# Patient Record
Sex: Female | Born: 1995
Health system: Southern US, Community
[De-identification: ages and names within clinical notes are randomized; demographics above are authoritative.]

## PROBLEM LIST (undated history)

## (undated) DIAGNOSIS — D649 Anemia, unspecified: Secondary | ICD-10-CM

---
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
## 2015-03-07 ENCOUNTER — Emergency Department
Admission: EM | Admit: 2015-03-07 | Discharge: 2015-03-07 | Discharge status: Against Medical Advice | Payer: Medicaid Other | Attending: Emergency Medicine | Admitting: Emergency Medicine

## 2015-03-07 ENCOUNTER — Encounter: Payer: Self-pay | Admitting: Urgent Care

## 2015-03-07 DIAGNOSIS — R112 Nausea with vomiting, unspecified: Secondary | ICD-10-CM | POA: Diagnosis present

## 2015-03-07 DIAGNOSIS — R509 Fever, unspecified: Secondary | ICD-10-CM | POA: Diagnosis not present

## 2015-03-07 DIAGNOSIS — R1031 Right lower quadrant pain: Secondary | ICD-10-CM | POA: Diagnosis not present

## 2015-03-07 DIAGNOSIS — R197 Diarrhea, unspecified: Secondary | ICD-10-CM | POA: Diagnosis not present

## 2015-03-07 DIAGNOSIS — Z3202 Encounter for pregnancy test, result negative: Secondary | ICD-10-CM | POA: Diagnosis not present

## 2015-03-07 LAB — COMPREHENSIVE METABOLIC PANEL
ALBUMIN: 3.8 g/dL (ref 3.5–5.0)
ALK PHOS: 66 U/L (ref 38–126)
ALT: 16 U/L (ref 14–54)
ANION GAP: 7 (ref 5–15)
AST: 22 U/L (ref 15–41)
BUN: 12 mg/dL (ref 6–20)
CALCIUM: 8.8 mg/dL — AB (ref 8.9–10.3)
CHLORIDE: 105 mmol/L (ref 101–111)
CO2: 23 mmol/L (ref 22–32)
Creatinine, Ser: 0.95 mg/dL (ref 0.44–1.00)
GFR calc non Af Amer: >60 mL/min (ref >60)
GLUCOSE: 108 mg/dL — AB (ref 65–99)
POTASSIUM: 3.7 mmol/L (ref 3.5–5.1)
SODIUM: 135 mmol/L (ref 135–145)
Total Bilirubin: 0.2 mg/dL — ABNORMAL LOW (ref 0.3–1.2)
Total Protein: 7.6 g/dL (ref 6.5–8.1)

## 2015-03-07 LAB — URINALYSIS COMPLETE WITH MICROSCOPIC (ARMC ONLY)
BACTERIA UA: NONE SEEN
Bilirubin Urine: NEGATIVE
Glucose, UA: NEGATIVE mg/dL
Hgb urine dipstick: NEGATIVE
KETONES UR: NEGATIVE mg/dL
Nitrite: NEGATIVE
PH: 5 (ref 5.0–8.0)
PROTEIN: NEGATIVE mg/dL
SPECIFIC GRAVITY, URINE: 1.023 (ref 1.005–1.030)

## 2015-03-07 LAB — INFLUENZA PANEL BY PCR (TYPE A & B)
H1N1 flu by pcr: NOT DETECTED
INFLBPCR: NEGATIVE
Influenza A By PCR: NEGATIVE

## 2015-03-07 LAB — PREGNANCY, URINE: PREG TEST UR: NEGATIVE

## 2015-03-07 LAB — CBC
HEMATOCRIT: 42.5 % (ref 35.0–47.0)
HEMOGLOBIN: 13.9 g/dL (ref 12.0–16.0)
MCH: 26.8 pg (ref 26.0–34.0)
MCHC: 32.7 g/dL (ref 32.0–36.0)
MCV: 81.8 fL (ref 80.0–100.0)
Platelets: 264 10*3/uL (ref 150–440)
RBC: 5.19 MIL/uL (ref 3.80–5.20)
RDW: 14.7 % — ABNORMAL HIGH (ref 11.5–14.5)
WBC: 7.9 10*3/uL (ref 3.6–11.0)

## 2015-03-07 LAB — LIPASE, BLOOD: LIPASE: 26 U/L (ref 11–51)

## 2015-03-07 MED ORDER — ACETAMINOPHEN 325 MG PO TABS
650.0000 mg | ORAL_TABLET | Freq: Once | ORAL | Status: AC
  Administered 2015-03-07: 650 mg via ORAL

## 2015-03-07 MED ORDER — ONDANSETRON HCL 4 MG/2ML IJ SOLN
4.0000 mg | Freq: Once | INTRAMUSCULAR | Status: AC | PRN
  Administered 2015-03-07: 4 mg via INTRAVENOUS

## 2015-03-07 MED ORDER — ONDANSETRON HCL 4 MG/2ML IJ SOLN
INTRAMUSCULAR | Status: AC
  Filled 2015-03-07: qty 2

## 2015-03-07 MED ORDER — ACETAMINOPHEN 325 MG PO TABS
ORAL_TABLET | ORAL | Status: AC
  Filled 2015-03-07: qty 2

## 2015-03-07 MED ORDER — SODIUM CHLORIDE 0.9 % IV BOLUS (SEPSIS)
1000.0000 mL | Freq: Once | INTRAVENOUS | Status: AC
  Administered 2015-03-07: 1000 mL via INTRAVENOUS

## 2015-03-07 MED ORDER — AZITHROMYCIN 250 MG PO TABS
ORAL_TABLET | ORAL | Status: AC
  Filled 2015-03-07: qty 2

## 2015-03-07 NOTE — ED Notes (Addendum)
Patient presents with c/o N/V/D x 2 days. (+) fever; unable to report tmax. Daughter Dx'd with influenza last week. Also c/o diffuse abdominal pain.

## 2015-03-07 NOTE — ED Notes (Signed)
Pt refusing CT scan, wants to leave, informed Dr Manson PasseyBrown.

## 2015-03-08 NOTE — ED Provider Notes (Signed)
[facility] Emergency Department Provider Note  ____________________________________________  Time seen: 3:45 AM  I have reviewed the triage vital signs and the nursing notes.   HISTORY  Chief Complaint Nausea; Emesis; and Diarrhea      HPI Chelsea Mcpherson is a 19 y.o. female presents with nausea vomiting and diarrhea 2 days accompanied by subjective fever. Patient states the symptoms started after eating out 2 days ago. In addition patient admits that her daughter was diagnosed with influenza 1 week ago.     Past medical history None There are no active problems to display for this patient.   Past surgical history None No current outpatient prescriptions on file.  Allergies No known drug allergies No family history on file.  Social History Social History  Substance Use Topics  . Smoking status: Never Smoker   . Smokeless tobacco: None  . Alcohol Use: No    Review of Systems  Constitutional: Negative for fever. Eyes: Negative for visual changes. ENT: Negative for sore throat. Cardiovascular: Negative for chest pain. Respiratory: Negative for shortness of breath. Gastrointestinal: Positive for abdominal pain, vomiting and diarrhea. Genitourinary: Negative for dysuria. Musculoskeletal: Negative for back pain. Skin: Negative for rash. Neurological: Negative for headaches, focal weakness or numbness.   10-point ROS otherwise negative.  ____________________________________________   PHYSICAL EXAM:  VITAL SIGNS: ED Triage Vitals  Enc Vitals Group     BP 03/07/15 0227 115/68 mmHg     Pulse Rate 03/07/15 0227 115     Resp 03/07/15 0227 20     Temp 03/07/15 0227 100.6 F (38.1 C)     Temp Source 03/07/15 0227 Oral     SpO2 03/07/15 0227 99 %     Weight 03/07/15 0227 200 lb (90.719 kg)     Height 03/07/15 0227  (1.702 m)     Head Cir --      Peak Flow --      Pain Score 03/07/15 0228 7     Pain Loc --      Pain  Edu? --      Excl. in [place]? --      Constitutional: Alert and oriented. Well appearing and in no distress. Eyes: Conjunctivae are normal. PERRL. Normal extraocular movements. ENT   Head: Normocephalic and atraumatic.   Nose: No congestion/rhinnorhea.   Mouth/Throat: Mucous membranes are moist.   Neck: No stridor. Hematological/Lymphatic/Immunilogical: No cervical lymphadenopathy. Cardiovascular: Normal rate, regular rhythm. Normal and symmetric distal pulses are present in all extremities. No murmurs, rubs, or gallops. Respiratory: Normal respiratory effort without tachypnea nor retractions. Breath sounds are clear and equal bilaterally. No wheezes/rales/rhonchi. Gastrointestinal: Right lower quadrant tenderness to palpation. No distention. There is no CVA tenderness. Genitourinary: deferred Musculoskeletal: Nontender with normal range of motion in all extremities. No joint effusions.  No lower extremity tenderness nor edema. Neurologic:  Normal speech and language. No gross focal neurologic deficits are appreciated. Speech is normal.  Skin:  Skin is warm, dry and intact. No rash noted. Psychiatric: Mood and affect are normal. Speech and behavior are normal. Patient exhibits appropriate insight and judgment.  ____________________________________________    LABS (pertinent positives/negatives)  Labs Reviewed  COMPREHENSIVE METABOLIC PANEL - Abnormal; Notable for the following:    Glucose, Bld 108 (*)    Calcium 8.8 (*)    Total Bilirubin 0.2 (*)    All other components within normal limits  CBC - Abnormal; Notable for the following:    RDW 14.7 (*)  All other components within normal limits  URINALYSIS COMPLETEWITH MICROSCOPIC ([facility] ONLY) - Abnormal; Notable for the following:    Color, Urine YELLOW (*)    APPearance HAZY (*)    Leukocytes, UA TRACE (*)    Squamous Epithelial / LPF 6-30 (*)    All other components within normal limits  LIPASE, BLOOD  PREGNANCY,  URINE  INFLUENZA PANEL BY PCR (TYPE A & B, H1N1)         INITIAL IMPRESSION / ASSESSMENT AND PLAN / ED COURSE  Pertinent labs & imaging results that were available during my care of the patient were reviewed by me and considered in my medical decision making (see chart for details).  Given right lower quadrant pain with palpation, vomiting and fever concern for possible appendicitis as such CT scan of the abdomen and pelvis was ordered. Patient eloped from the emergency department for scan was performed. ____________________________________________   FINAL CLINICAL IMPRESSION(S) / ED DIAGNOSES  Final diagnoses:  Right lower quadrant abdominal pain      Darci Currentandolph N Dyneshia Baccam, MD 03/08/15 [phone]

## 2018-12-08 ENCOUNTER — Encounter: Payer: Self-pay | Admitting: Emergency Medicine

## 2018-12-08 ENCOUNTER — Other Ambulatory Visit: Payer: Self-pay

## 2018-12-08 ENCOUNTER — Emergency Department: Payer: No Typology Code available for payment source

## 2018-12-08 ENCOUNTER — Emergency Department
Admission: EM | Admit: 2018-12-08 | Discharge: 2018-12-08 | Disposition: A | Discharge status: Home / Self Care | Payer: No Typology Code available for payment source | Attending: Emergency Medicine | Admitting: Emergency Medicine

## 2018-12-08 DIAGNOSIS — Y939 Activity, unspecified: Secondary | ICD-10-CM | POA: Diagnosis not present

## 2018-12-08 DIAGNOSIS — Y929 Unspecified place or not applicable: Secondary | ICD-10-CM | POA: Insufficient documentation

## 2018-12-08 DIAGNOSIS — Y999 Unspecified external cause status: Secondary | ICD-10-CM | POA: Insufficient documentation

## 2018-12-08 DIAGNOSIS — M542 Cervicalgia: Secondary | ICD-10-CM | POA: Insufficient documentation

## 2018-12-08 MED ORDER — MELOXICAM 15 MG PO TABS: 15.0000 mg | ORAL_TABLET | Freq: Every day | ORAL | 1 refills | Status: AC

## 2018-12-08 MED ORDER — KETOROLAC TROMETHAMINE 30 MG/ML IJ SOLN
30.0000 mg | Freq: Once | INTRAMUSCULAR | Status: AC
  Administered 2018-12-08: 30 mg via INTRAMUSCULAR
  Filled 2018-12-08: qty 1

## 2018-12-08 NOTE — Discharge Instructions (Signed)
I expect you to have neck stiffness and pain in the days ahead. You have been prescribed meloxicam as a daily anti-inflammatory.
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
## 2018-12-19 ENCOUNTER — Other Ambulatory Visit: Payer: Self-pay | Admitting: Family Medicine

## 2018-12-27 ENCOUNTER — Other Ambulatory Visit: Payer: Self-pay

## 2018-12-27 ENCOUNTER — Ambulatory Visit
Admission: RE | Admit: 2018-12-27 | Discharge: 2018-12-27 | Disposition: A | Discharge status: Home / Self Care | Payer: Medicaid Other | Source: Ambulatory Visit | Attending: Family Medicine | Admitting: Family Medicine

## 2018-12-27 DIAGNOSIS — N281 Cyst of kidney, acquired: Secondary | ICD-10-CM | POA: Diagnosis not present

## 2018-12-27 DIAGNOSIS — R109 Unspecified abdominal pain: Secondary | ICD-10-CM | POA: Insufficient documentation

## 2019-04-07 NOTE — L&D Delivery Note (Signed)
Date of delivery: 10/23/2019 Estimated Date of Delivery: 10/26/19 Patient's last menstrual period was 01/19/2019 (exact date). EGA: [redacted]w[redacted]d  Delivery Note At 7:51 PM a viable female was delivered via Vaginal, Spontaneous (Presentation: OA, ROA).  APGAR: 9,9; weight: 3250 g, 7 pounds 2 ounces.   Placenta status: Spontaneous, Intact.  Cord: 3 vessels with the following complications: None.  Cord pH: NA  Patient arrived to L&D at 8 cm with a bulging bag of fluid. She labored for a short time more and then pushed to deliver a viable female infant.  The head followed by shoulders, which delivered without difficulty, and the rest of the body.  No nuchal cord noted.  Baby to mom's chest.  Cord clamped and cut after 3 min delay.  No cord blood obtained.  Placenta delivered spontaneously, intact, with a 3-vessel cord.  Perineum intact/no other lacerations.  All counts correct.  Hemostasis obtained with IV pitocin and fundal massage.   Anesthesia: None Episiotomy: none  Lacerations: none  Suture Repair: NA Est. Blood Loss (mL): 200   Mom to postpartum.  Baby to Couplet care / Skin to Skin.  Tresea Mall, CNM 10/23/2019, 8:08 PM

## 2019-05-08 ENCOUNTER — Other Ambulatory Visit: Payer: Self-pay | Admitting: Physician Assistant

## 2019-05-08 DIAGNOSIS — R1011 Right upper quadrant pain: Secondary | ICD-10-CM

## 2019-05-12 ENCOUNTER — Ambulatory Visit
Admission: RE | Admit: 2019-05-12 | Discharge: 2019-05-12 | Disposition: A | Discharge status: Home / Self Care | Payer: Medicaid Other | Source: Ambulatory Visit | Attending: Physician Assistant | Admitting: Physician Assistant

## 2019-05-12 ENCOUNTER — Other Ambulatory Visit: Payer: Self-pay

## 2019-05-12 DIAGNOSIS — R1011 Right upper quadrant pain: Secondary | ICD-10-CM | POA: Diagnosis not present

## 2019-05-23 ENCOUNTER — Encounter: Payer: Medicaid Other | Admitting: Obstetrics & Gynecology

## 2019-05-24 ENCOUNTER — Ambulatory Visit (INDEPENDENT_AMBULATORY_CARE_PROVIDER_SITE_OTHER): Payer: Medicaid Other | Admitting: Advanced Practice Midwife

## 2019-05-24 ENCOUNTER — Encounter: Payer: Self-pay | Admitting: Advanced Practice Midwife

## 2019-05-24 ENCOUNTER — Other Ambulatory Visit (HOSPITAL_COMMUNITY)
Admission: RE | Admit: 2019-05-24 | Discharge: 2019-05-24 | Disposition: A | Discharge status: Home / Self Care | Payer: Medicaid Other | Source: Ambulatory Visit | Attending: Advanced Practice Midwife | Admitting: Advanced Practice Midwife

## 2019-05-24 ENCOUNTER — Other Ambulatory Visit: Payer: Self-pay

## 2019-05-24 VITALS — BP 123/66 | HR 69 | Wt 189.0 lb

## 2019-05-24 DIAGNOSIS — Z3A17 17 weeks gestation of pregnancy: Secondary | ICD-10-CM | POA: Diagnosis not present

## 2019-05-24 DIAGNOSIS — Z113 Encounter for screening for infections with a predominantly sexual mode of transmission: Secondary | ICD-10-CM | POA: Diagnosis present

## 2019-05-24 DIAGNOSIS — Z3482 Encounter for supervision of other normal pregnancy, second trimester: Secondary | ICD-10-CM

## 2019-05-24 DIAGNOSIS — Z3480 Encounter for supervision of other normal pregnancy, unspecified trimester: Secondary | ICD-10-CM

## 2019-05-24 DIAGNOSIS — Z124 Encounter for screening for malignant neoplasm of cervix: Secondary | ICD-10-CM

## 2019-05-24 NOTE — Patient Instructions (Signed)
Exercise During Pregnancy Exercise is an important part of being healthy for people of all ages. Exercise improves the function of your heart and lungs and helps you maintain strength, flexibility, and a healthy body weight. Exercise also boosts energy levels and elevates mood. Most women should exercise regularly during pregnancy. In rare cases, women with certain medical conditions or complications may be asked to limit or avoid exercise during pregnancy. How does this affect me? Along with maintaining general strength and flexibility, exercising during pregnancy can help:  Keep strength in muscles that are used during labor and childbirth.  Decrease low back pain.  Reduce symptoms of depression.  Control weight gain during pregnancy.  Reduce the risk of needing insulin if you develop diabetes during pregnancy.  Decrease the risk of cesarean delivery.  Speed up your recovery after giving birth. How does this affect my baby? Exercise can help you have a healthy pregnancy. Exercise does not cause premature birth. It will not cause your baby to weigh less at birth. What exercises can I do? Many exercises are safe for you to do during pregnancy. Do a variety of exercises that safely increase your heart and breathing rates and help you build and maintain muscle strength. Do exercises exactly as told by your health care provider. You may do these exercises:  Walking or hiking.  Swimming.  Water aerobics.  Riding a stationary bike.  Strength training.  Modified yoga or Pilates. Tell your instructor that you are pregnant. Avoid overstretching, and avoid lying on your back for long periods of time.  Running or jogging. Only choose this type of exercise if you: ? Ran or jogged regularly before your pregnancy. ? Can run or jog and still talk in complete sentences. What exercises should I avoid? Depending on your level of fitness and whether you exercised regularly before your  pregnancy, you may be told to limit high-intensity exercise. You can tell that you are exercising at a high intensity if you are breathing much harder and faster and cannot hold a conversation while exercising. You must avoid:  Contact sports.  Activities that put you at risk for falling on or being hit in the belly, such as downhill skiing, water skiing, surfing, rock climbing, cycling, gymnastics, and horseback riding.  Scuba diving.  Skydiving.  Yoga or Pilates in a room that is heated to high temperatures.  Jogging or running, unless you ran or jogged regularly before your pregnancy. While jogging or running, you should always be able to talk in full sentences. Do not run or jog so fast that you are unable to have a conversation.  Do not exercise at more than 6,000 feet above sea level (high elevation) if you are not used to exercising at high elevation. How do I exercise in a safe way?   Avoid overheating. Do not exercise in very high temperatures.  Wear loose-fitting, breathable clothes.  Avoid dehydration. Drink enough water before, during, and after exercise to keep your urine pale yellow.  Avoid overstretching. Because of hormone changes during pregnancy, it is easy to overstretch muscles, tendons, and ligaments during pregnancy.  Start slowly and ask your health care provider to recommend the types of exercise that are safe for you.  Do not exercise to lose weight. Follow these instructions at home:  Exercise on most days or all days of the week. Try to exercise for 30 minutes a day, 5 days a week, unless your health care provider tells you not to.  If   you actively exercised before your pregnancy and you are healthy, your health care provider may tell you to continue to do moderate to high-intensity exercise.  If you are just starting to exercise or did not exercise much before your pregnancy, your health care provider may tell you to do low to moderate-intensity  exercise. Questions to ask your health care provider  Is exercise safe for me?  What are signs that I should stop exercising?  Does my health condition mean that I should not exercise during pregnancy?  When should I avoid exercising during pregnancy? Stop exercising and contact a health care provider if: You have any unusual symptoms, such as:  Mild contractions of the uterus or cramps in the abdomen.  Dizziness that does not go away when you rest. Stop exercising and get help right away if: You have any unusual symptoms, such as:  Sudden, severe pain in your low back or your belly.  Mild contractions of the uterus or cramps in the abdomen that do not improve with rest and drinking fluids.  Chest pain.  Bleeding or fluid leaking from your vagina.  Shortness of breath. These symptoms may represent a serious problem that is an emergency. Do not wait to see if the symptoms will go away. Get medical help right away. Call your local emergency services (911 in the [place] Do not drive yourself to the hospital. Summary  Most women should exercise regularly throughout pregnancy. In rare cases, women with certain medical conditions or complications may be asked to limit or avoid exercise during pregnancy.  Do not exercise to lose weight during pregnancy.  Your health care provider will tell you what level of physical activity is right for you.  Stop exercising and contact a health care provider if you have mild contractions of the uterus or cramps in the abdomen. Get help right away if these contractions or cramps do not improve with rest and drinking fluids.  Stop exercising and get help right away if you have sudden, severe pain in your low back or belly, chest pain, shortness of breath, or bleeding or leaking of fluid from your vagina. This information is not intended to replace advice given to you by your health care provider. Make sure you discuss any questions you have with your  health care provider. Document Revised: 07/14/2018 Document Reviewed: 04/27/2018 Elsevier Patient Education  2020 [employer] Eating Plan for Pregnant Women While you are pregnant, your body requires additional nutrition to help support your growing baby. You also have a higher need for some vitamins and minerals, such as folic acid, calcium, iron, and vitamin D. Eating a healthy, well-balanced diet is very important for your health and your baby's health. Your need for extra calories varies for the three 3-month segments of your pregnancy (trimesters). For most women, it is recommended to consume:  150 extra calories a day during the first trimester.  300 extra calories a day during the second trimester.  300 extra calories a day during the third trimester. What are tips for following this plan?   Do not try to lose weight or go on a diet during pregnancy.  Limit your overall intake of foods that have "empty calories." These are foods that have little nutritional value, such as sweets, desserts, candies, and sugar-sweetened beverages.  Eat a variety of foods (especially fruits and vegetables) to get a full range of vitamins and minerals.  Take a prenatal vitamin to help meet your additional vitamin and mineral needs   during pregnancy, specifically for folic acid, iron, calcium, and vitamin D.  Remember to stay active. Ask your health care provider what types of exercise and activities are safe for you.  Practice good food safety and cleanliness. Wash your hands before you eat and after you prepare raw meat. Wash all fruits and vegetables well before peeling or eating. Taking these actions can help to prevent food-borne illnesses that can be very dangerous to your baby, such as listeriosis. Ask your health care provider for more information about listeriosis. What does 150 extra calories look like? Healthy options that provide 150 extra calories each day could be any of the  following:  6-8 oz (170-230 g) of plain low-fat yogurt with  cup of berries.  1 apple with 2 teaspoons (11 g) of peanut butter.  Cut-up vegetables with  cup (60 g) of hummus.  8 oz (230 mL) or 1 cup of low-fat chocolate milk.  1 stick of string cheese with 1 medium orange.  1 peanut butter and jelly sandwich that is made with one slice of whole-wheat bread and 1 tsp (5 g) of peanut butter. For 300 extra calories, you could eat two of those healthy options each day. What is a healthy amount of weight to gain? The right amount of weight gain for you is based on your BMI before you became pregnant. If your BMI:  Was less than 18 (underweight), you should gain 28-40 lb (13-18 kg).  Was 18-24.9 (normal), you should gain 25-35 lb (11-16 kg).  Was 25-29.9 (overweight), you should gain 15-25 lb (7-11 kg).  Was 30 or greater (obese), you should gain 11-20 lb (5-9 kg). What if I am having twins or multiples? Generally, if you are carrying twins or multiples:  You may need to eat 300-600 extra calories a day.  The recommended range for total weight gain is 25-54 lb (11-25 kg), depending on your BMI before pregnancy.  Talk with your health care provider to find out about nutritional needs, weight gain, and exercise that is right for you. What foods can I eat?  Fruits All fruits. Eat a variety of colors and types of fruit. Remember to wash your fruits well before peeling or eating. Vegetables All vegetables. Eat a variety of colors and types of vegetables. Remember to wash your vegetables well before peeling or eating. Grains All grains. Choose whole grains, such as whole-wheat bread, oatmeal, or brown rice. Meats and other protein foods Lean meats, including chicken, turkey, fish, and lean cuts of beef, veal, or pork. If you eat fish or seafood, choose options that are higher in omega-3 fatty acids and lower in mercury, such as salmon, herring, mussels, trout, sardines, pollock,  shrimp, crab, and lobster. Tofu. Tempeh. Beans. Eggs. Peanut butter and other nut butters. Make sure that all meats, poultry, and eggs are cooked to food-safe temperatures or "well-done." Two or more servings of fish are recommended each week in order to get the most benefits from omega-3 fatty acids that are found in seafood. Choose fish that are lower in mercury. You can find more information online:  www.fda.gov Dairy Pasteurized milk and milk alternatives (such as almond milk). Pasteurized yogurt and pasteurized cheese. Cottage cheese. Sour cream. Beverages Water. Juices that contain 100% fruit juice or vegetable juice. Caffeine-free teas and decaffeinated coffee. Drinks that contain caffeine are okay to drink, but it is better to avoid caffeine. Keep your total caffeine intake to less than 200 mg each day (which is 12 oz   or 355 mL of coffee, tea, or soda) or the limit as told by your health care provider. Fats and oils Fats and oils are okay to include in moderation. Sweets and desserts Sweets and desserts are okay to include in moderation. Seasoning and other foods All pasteurized condiments. The items listed above may not be a complete list of foods and beverages you can eat. Contact a dietitian for more information. What foods are not recommended? Fruits Unpasteurized fruit juices. Vegetables Raw (unpasteurized) vegetable juices. Meats and other protein foods Lunch meats, bologna, hot dogs, or other deli meats. (If you must eat those meats, reheat them until they are steaming hot.) Refrigerated pat, meat spreads from a meat counter, smoked seafood that is found in the refrigerated section of a store. Raw or undercooked meats, poultry, and eggs. Raw fish, such as sushi or sashimi. Fish that have high mercury content, such as tilefish, shark, swordfish, and king mackerel. To learn more about mercury in fish, talk with your health care provider or look for online resources, such  as:  www.fda.gov Dairy Raw (unpasteurized) milk and any foods that have raw milk in them. Soft cheeses, such as feta, queso blanco, queso fresco, Brie, Camembert cheeses, blue-veined cheeses, and Panela cheese (unless it is made with pasteurized milk, which must be stated on the label). Beverages Alcohol. Sugar-sweetened beverages, such as sodas, teas, or energy drinks. Seasoning and other foods Homemade fermented foods and drinks, such as pickles, sauerkraut, or kombucha drinks. (Store-bought pasteurized versions of these are okay.) Salads that are made in a store or deli, such as ham salad, chicken salad, egg salad, tuna salad, and seafood salad. The items listed above may not be a complete list of foods and beverages you should avoid. Contact a dietitian for more information. Where to find more information To calculate the number of calories you need based on your height, weight, and activity level, you can use an online calculator such as:  www.choosemyplate.gov/MyPlatePlan To calculate how much weight you should gain during pregnancy, you can use an online pregnancy weight gain calculator such as:  www.choosemyplate.gov/pregnancy-weight-gain-calculator Summary  While you are pregnant, your body requires additional nutrition to help support your growing baby.  Eat a variety of foods, especially fruits and vegetables to get a full range of vitamins and minerals.  Practice good food safety and cleanliness. Wash your hands before you eat and after you prepare raw meat. Wash all fruits and vegetables well before peeling or eating. Taking these actions can help to prevent food-borne illnesses, such as listeriosis, that can be very dangerous to your baby.  Do not eat raw meat or fish. Do not eat fish that have high mercury content, such as tilefish, shark, swordfish, and king mackerel. Do not eat unpasteurized (raw) dairy.  Take a prenatal vitamin to help meet your additional vitamin and  mineral needs during pregnancy, specifically for folic acid, iron, calcium, and vitamin D. This information is not intended to replace advice given to you by your health care provider. Make sure you discuss any questions you have with your health care provider. Document Revised: 08/11/2018 Document Reviewed: 12/18/2016 Elsevier Patient Education  2020 Elsevier Inc. Prenatal Care Prenatal care is health care during pregnancy. It helps you and your unborn baby (fetus) stay as healthy as possible. Prenatal care may be provided by a midwife, a family practice health care provider, or a childbirth and pregnancy specialist (obstetrician). How does this affect me? During pregnancy, you will be closely monitored   for any new conditions that might develop. To lower your risk of pregnancy complications, you and your health care provider will talk about any underlying conditions you have. How does this affect my baby? Early and consistent prenatal care increases the chance that your baby will be healthy during pregnancy. Prenatal care lowers the risk that your baby will be:  Born early (prematurely).  Smaller than expected at birth (small for gestational age). What can I expect at the first prenatal care visit? Your first prenatal care visit will likely be the longest. You should schedule your first prenatal care visit as soon as you know that you are pregnant. Your first visit is a good time to talk about any questions or concerns you have about pregnancy. At your visit, you and your health care provider will talk about:  Your medical history, including: ? Any past pregnancies. ? Your family's medical history. ? The baby's father's medical history. ? Any long-term (chronic) health conditions you have and how you manage them. ? Any surgeries or procedures you have had. ? Any current over-the-counter or prescription medicines, herbs, or supplements you are taking.  Other factors that could pose a risk  to your baby, including:  Your home setting and your stress levels, including: ? Exposure to abuse or violence. ? Household financial strain. ? Mental health conditions you have.  Your daily health habits, including diet and exercise. Your health care provider will also:  Measure your weight, height, and blood pressure.  Do a physical exam, including a pelvic and breast exam.  Perform blood tests and urine tests to check for: ? Urinary tract infection. ? Sexually transmitted infections (STIs). ? Low iron levels in your blood (anemia). ? Blood type and certain proteins on red blood cells (Rh antibodies). ? Infections and immunity to viruses, such as hepatitis B and rubella. ? HIV (human immunodeficiency virus).  Do an ultrasound to confirm your baby's growth and development and to help predict your estimated due date (EDD). This ultrasound is done with a probe that is inserted into the vagina (transvaginal ultrasound).  Discuss your options for genetic screening.  Give you information about how to keep yourself and your baby healthy, including: ? Nutrition and taking vitamins. ? Physical activity. ? How to manage pregnancy symptoms such as nausea and vomiting (morning sickness). ? Infections and substances that may be harmful to your baby and how to avoid them. ? Food safety. ? Dental care. ? Working. ? Travel. ? Warning signs to watch for and when to call your health care provider. How often will I have prenatal care visits? After your first prenatal care visit, you will have regular visits throughout your pregnancy. The visit schedule is often as follows:  Up to week 28 of pregnancy: once every 4 weeks.  28-36 weeks: once every 2 weeks.  After 36 weeks: every week until delivery. Some women may have visits more or less often depending on any underlying health conditions and the health of the baby. Keep all follow-up and prenatal care visits as told by your health care  provider. This is important. What happens during routine prenatal care visits? Your health care provider will:  Measure your weight and blood pressure.  Check for fetal heart sounds.  Measure the height of your uterus in your abdomen (fundal height). This may be measured starting around week 20 of pregnancy.  Check the position of your baby inside your uterus.  Ask questions about your diet, sleeping patterns, and   whether you can feel the baby move.  Review warning signs to watch for and signs of labor.  Ask about any pregnancy symptoms you are having and how you are dealing with them. Symptoms may include: ? Headaches. ? Nausea and vomiting. ? Vaginal discharge. ? Swelling. ? Fatigue. ? Constipation. ? Any discomfort, including back or pelvic pain. Make a list of questions to ask your health care provider at your routine visits. What tests might I have during prenatal care visits? You may have blood, urine, and imaging tests throughout your pregnancy, such as:  Urine tests to check for glucose, protein, or signs of infection.  Glucose tests to check for a form of diabetes that can develop during pregnancy (gestational diabetes mellitus). This is usually done around week 24 of pregnancy.  An ultrasound to check your baby's growth and development and to check for birth defects. This is usually done around week 20 of pregnancy.  A test to check for group B strep (GBS) infection. This is usually done around week 36 of pregnancy.  Genetic testing. This may include blood or imaging tests, such as an ultrasound. Some genetic tests are done during the first trimester and some are done during the second trimester. What else can I expect during prenatal care visits? Your health care provider may recommend getting certain vaccines during pregnancy. These may include:  A yearly flu shot (annual influenza vaccine). This is especially important if you will be pregnant during flu  season.  Tdap (tetanus, diphtheria, pertussis) vaccine. Getting this vaccine during pregnancy can protect your baby from whooping cough (pertussis) after birth. This vaccine may be recommended between weeks 27 and 36 of pregnancy. Later in your pregnancy, your health care provider may give you information about:  Childbirth and breastfeeding classes.  Choosing a health care provider for your baby.  Umbilical cord banking.  Breastfeeding.  Birth control after your baby is born.  The hospital labor and delivery unit and how to tour it.  Registering at the hospital before you go into labor. Where to find more information  Office on Women's Health: womenshealth.gov  American Pregnancy Association: americanpregnancy.org  March of Dimes: marchofdimes.org Summary  Prenatal care helps you and your baby stay as healthy as possible during pregnancy.  Your first prenatal care visit will most likely be the longest.  You will have visits and tests throughout your pregnancy to monitor your health and your baby's health.  Bring a list of questions to your visits to ask your health care provider.  Make sure to keep all follow-up and prenatal care visits with your health care provider. This information is not intended to replace advice given to you by your health care provider. Make sure you discuss any questions you have with your health care provider. Document Revised: 07/13/2018 Document Reviewed: 03/22/2017 Elsevier Patient Education  2020 Elsevier Inc.     COVID-19 and Your Pregnancy FAQ  How can I prevent infection with COVID-19 during my pregnancy? Social distancing is key. Please limit any interactions in public. Try and work from home if possible. Frequently wash your hands after touching possibly contaminated surfaces. Avoid touching your face.  Minimize trips to the store. Consider online ordering when possible.   Should I wear a mask? YES. It is recommended by the CDC  that all people wear a cloth mask or facial covering in public. You should wear a mask to your visits in the office. This will help reduce transmission as well as your   risk or acquiring COVID-19. New studies are showing that even asymptomatic individuals can spread the virus from talking.   Where can I get a mask? [place] and the city of [place] are partnering to provide masks to community members. You can pick up a mask from several locations. This website also has instructions about how to make a mask by sewing or without sewing by using a t-shirt or bandana.  https://www.[place].gov/i-want-to/learn-about/covid-19-information-and-updates/covid-19-face-mask-project  Studies have shown that if you were a tube or nylon stocking from pantyhose over a cloth mask it makes the cloth mask almost as effective as a N95 mask.  https://www.npr.org/sections/goatsandsoda/2018/07/27/840146830/adding-a-nylon-stocking-layer-could-boost-protection-from-cloth-masks-study-find  What are the symptoms of COVID-19? Fever (greater than 100.4 F), dry cough, shortness of breath.  Am I more at risk for COVID-19 since I am pregnant? There is not currently data showing that pregnant women are more adversely impacted by COVID-19 than the general population. However, we know that pregnant women tend to have worse respiratory complications from similar diseases such as the flu and SARS and for this reason should be considered an at-risk population.  What do I do if I am experiencing the symptoms of COVID-19? Testing is being limited because of test availability. If you are experiencing symptoms you should quarantine yourself, and the members of your family, for at least 2 weeks at home.   Please visit this website for more information: https://www.cdc.gov/coronavirus/2019-ncov/if-you-are-sick/steps-when-sick.html  When should I go to the Emergency Room? Please go to the emergency room if you are experiencing  ANY of these symptoms*:  1.    Difficulty breathing or shortness of breath 2.    Persistent pain or pressure in the chest 3.    Confusion or difficulty being aroused (or awakened) 4.    Bluish lips or face  *This list is not all inclusive. Please consult our office for any other symptoms that are severe or concerning.  What do I do if I am having difficulty breathing? You should go to the Emergency Room for evaluation. At this time they have a tent set up for evaluating patients with COVID-19 symptoms.   How will my prenatal care be different because of the COVID-19 pandemic? It has been recommended to reduce the frequency of face-to-face visits and use resources such as telephone and virtual visits when possible. Using a scale, blood pressure machine and fetal doppler at home can further help reduce face-to-face visits. You will be provided with additional information on this topic.  We ask that you come to your visits alone to minimize potential exposures to  COVID-19.  How can I receive childbirth education? At this time in-person classes have been cancelled. You can register for online childbirth education, breastfeeding, and newborn care classes.  Please visit:  [email or web address] for more information  How will my hospital birth experience be different? The hospital is currently limiting visitors. This means that while you are in labor you can only have one person at the hospital with you. Additional family members will not be allowed to wait in the building or outside your room. Your one support person can be the father of the baby, a relative, a doula, or a friend. Once one support person is designated that person will wear a band. This band cannot be shared with multiple people.  Nitrous Gas is not being offered for pain relief since the tubing and filter for the machine can not be sanitized in a way to guarantee prevention of transmission of COVID-19.  Nasal cannula use of    oxygen for fetal indications has also been discontinued.  Currently a clear plastic sheet is being hung between mom and the delivering provider during pushing and delivery to help prevent transmission of COVID-19.      How long will I stay in the hospital for after giving birth? It is also recommended that discharge home be expedited during the COVID-19 outbreak. This means staying for 1 day after a vaginal delivery and 2 days after a cesarean section. Patients who need to stay longer for medical reasons are allowed to do so, but the goal will be for expedited discharge home.   What if I have COVID-19 and I am in labor? We ask that you wear a mask while on labor and delivery. We will try and accommodate you being placed in a room that is capable of filtering the air. Please call ahead if you are in labor and on your way to the hospital. The phone number for labor and delivery at  [facility] is [phone].  If I have COVID-19 when my baby is born how can I prevent my baby from contracting COVID-19? This is an issue that will have to be discussed on a case-by-case basis. Current recommendations suggest providing separate isolation rooms for both the mother and new infant as well as limiting visitors. However, there are practical challenges to this recommendation. The situation will assuredly change and decisions will be influenced by the desires of the mother and availability of space.  Some suggestions are the use of a curtain or physical barrier between mom and infant, hand hygiene, mom wearing a mask, or 6 feet of spacing between a mom and infant.   Can I breastfeed during the COVID-19 pandemic?   Yes, breastfeeding is encouraged.  Can I breastfeed if I have COVID-19? Yes. Covid-19 has not been found in breast milk. This means you cannot give COVID-19 to your child through breast milk. Breast feeding will also help pass antibodies to fight infection to your baby.   What  precautions should I take when breastfeeding if I have COVID-19? If a mother and newborn do room-in and the mother wishes to feed at the breast, she should put on a facemask and practice hand hygiene before each feeding.  What precautions should I take when pumping if I have COVID-19? Prior to expressing breast milk, mothers should practice hand hygiene. After each pumping session, all parts that come into contact with breast milk should be thoroughly washed and the entire pump should be appropriately disinfected per the manufacturer's instructions. This expressed breast milk should be fed to the newborn by a healthy caregiver.  What if I am pregnant and work in healthcare? Based on limited data regarding COVID-19 and pregnancy, ACOG currently does not propose creating additional restrictions on pregnant health care personnel because of COVID-19 alone. Pregnant women do not appear to be at higher risk of severe disease related to COVID-19. Pregnant health care personnel should follow CDC risk assessment and infection control guidelines for health care personnel exposed to patients with suspected or confirmed COVID-19. Adherence to recommended infection prevention and control practices is an important part of protecting all health care personnel in health care settings.    Information on COVID-19 in pregnancy is very limited; however, facilities may want to consider limiting exposure of pregnant health care personnel to patients with confirmed or suspected COVID-19 infection, especially during higher-risk procedures (eg, aerosol-generating procedures), if feasible, based on staffing availability.    

## 2019-05-24 NOTE — Progress Notes (Signed)
New Obstetric Patient H&P    Chief Complaint: "Desires prenatal care"   History of Present Illness: Patient is a 24 y.o. G2P1001 Not Hispanic or Latino female, presents with amenorrhea and positive home pregnancy test. Patient's last menstrual period was 01/19/2019 (exact date). and based on her  LMP, her EDD is Estimated Date of Delivery: 10/26/19 and her EGA is [redacted]w[redacted]d. Cycles are 7. days, regular, and occur approximately every : 28 days. Her last pap smear was 5 or 6 years ago and was no abnormalities.    She had a urine pregnancy test which was positive 2 or 3 month(s)  ago. Her last menstrual period was normal and lasted for  7 day(s). Since her LMP she claims she has experienced breast tenderness, fatigue, nausea, vomiting. She denies vaginal bleeding. Her past medical history is noncontributory. Her prior pregnancies are notable for FT SVD 2014 6#5oz  Since her LMP, she admits to the use of tobacco products  no She claims she has gained   9 pounds since the start of her pregnancy.  There are cats in the home in the home  no  She admits close contact with children on a regular basis  yes  She has had chicken pox in the past no She has had Tuberculosis exposures, symptoms, or previously tested positive for TB   no Current or past history of domestic violence. no  Genetic Screening/Teratology Counseling: (Includes patient, baby's father, or anyone in either family with:)   1. Patient's age >/= 34 at [facility]  no 2. Thalassemia ([place], [place], Mediterranean, or Asian background): MCV<80  no 3. Neural tube defect (meningomyelocele, spina bifida, anencephaly)  no 4. Congenital heart defect  no  5. Down syndrome  no 6. Tay-Sachs (Jewish, [place]  no 7. Canavan's Disease  no 8. Sickle cell disease or trait (African)  no  9. Hemophilia or other blood disorders  no  10. Muscular dystrophy  no  11. Cystic fibrosis  no  12. Huntington's Chorea  no  13. Mental retardation/autism  no  14. Other inherited genetic or chromosomal disorder  no 15. Maternal metabolic disorder (DM, PKU, etc)  no 16. Patient or FOB with a child with a birth defect not listed above no  16a. Patient or FOB with a birth defect themselves no 17. Recurrent pregnancy loss, or stillbirth  no  18. Any medications since LMP other than prenatal vitamins (include vitamins, supplements, OTC meds, drugs, alcohol)  no 19. Any other genetic/environmental exposure to discuss  no  Infection History:   1. Lives with someone with TB or TB exposed  no  2. Patient or partner has history of genital herpes  no 3. Rash or viral illness since LMP  no 4. History of STI (GC, CT, HPV, syphilis, HIV)  no 5. History of recent travel :  no  Other pertinent information:  no     Review of Systems:10 point review of systems negative unless otherwise noted in HPI  Past Medical History:  History reviewed. No pertinent past medical history.  Past Surgical History:  History reviewed. No pertinent surgical history.  Gynecologic History: Patient's last menstrual period was 01/19/2019 (exact date).  Obstetric History: G2P1001  Family History:  History reviewed. No pertinent family history.  Social History:  Social History   Socioeconomic History  . Marital status: Single    Spouse name: Not on file  . Number of children: Not on file  . Years of education: Not on file  .  Highest education level: Not on file  Occupational History  . Not on file  Tobacco Use  . Smoking status: Never Smoker  . Smokeless tobacco: Never Used  Substance and Sexual Activity  . Alcohol use: No  . Drug use: Not Currently    Types: Marijuana  . Sexual activity: Yes    Birth control/protection: None  Other Topics Concern  . Not on file  Social History Narrative  . Not on file   Social Determinants of Health   Financial Resource Strain:   . Difficulty of Paying Living Expenses: Not on file  Food Insecurity:   . Worried About  Programme researcher, broadcasting/film/video in the Last Year: Not on file  . Ran Out of Food in the Last Year: Not on file  Transportation Needs:   . Lack of Transportation (Medical): Not on file  . Lack of Transportation (Non-Medical): Not on file  Physical Activity:   . Days of Exercise per Week: Not on file  . Minutes of Exercise per Session: Not on file  Stress:   . Feeling of Stress : Not on file  Social Connections:   . Frequency of Communication with Friends and Family: Not on file  . Frequency of Social Gatherings with Friends and Family: Not on file  . Attends Religious Services: Not on file  . Active Member of Clubs or Organizations: Not on file  . Attends Banker Meetings: Not on file  . Marital Status: Not on file  Intimate Partner Violence:   . Fear of Current or Ex-Partner: Not on file  . Emotionally Abused: Not on file  . Physically Abused: Not on file  . Sexually Abused: Not on file    Allergies:  No Known Allergies  Medications: Prior to Admission medications   Not on File    Physical Exam Vitals: Blood pressure 123/66, pulse 69, weight 189 lb (85.7 kg), last menstrual period 01/19/2019.  General: NAD HEENT: normocephalic, anicteric Thyroid: no enlargement, no palpable nodules Pulmonary: No increased work of breathing, CTAB Cardiovascular: RRR, distal pulses 2+ Abdomen: NABS, soft, non-tender, non-distended.  Umbilicus without lesions.  No hepatomegaly, splenomegaly or masses palpable. No evidence of hernia  Genitourinary:  External: Normal external female genitalia.  Normal urethral meatus, normal  Bartholin's and Skene's glands.    Vagina: Normal vaginal mucosa, no evidence of prolapse.    Cervix: Grossly normal in appearance, no bleeding, no CMT  Uterus:  Non-enlarged, mobile, normal contour.    Adnexa: ovaries non-enlarged, no adnexal masses  Rectal: deferred Extremities: no edema, erythema, or tenderness Neurologic: Grossly intact Psychiatric: mood  appropriate, affect full   The following were addressed during this visit:  Breastfeeding Education - Early initiation of breastfeeding    Comments: Keeps milk supply adequate, helps contract uterus and slow bleeding, and early milk is the perfect first food and is easy to digest.   - The importance of exclusive breastfeeding    Comments: Provides antibodies, Lower risk of breast and ovarian cancers, and type-2 diabetes,Helps your body recover, Reduced chance of SIDS.   - Risks of giving your baby anything other than breast milk if you are breastfeeding    Comments: Make the baby less content with breastfeeds, may make my baby more susceptible to illness, and may reduce my milk supply.   - The importance of early skin-to-skin contact    Comments: Keeps baby warm and secure, helps keep baby's blood sugar up and breathing steady, easier to bond and breastfeed,  and helps calm baby.  - Rooming-in on a 24-hour basis    Comments: Easier to learn baby's feeding cues, easier to bond and get to know each other, and encourages milk production.   - Feeding on demand or baby-led feeding    Comments: Helps prevent breastfeeding complications, helps bring in good milk supply, prevents under or overfeeding, and helps baby feel content and satisfied   - Frequent feeding to help assure optimal milk production    Comments: Making a full supply of milk requires frequent removal of milk from breasts, infant will eat 8-12 times in 24 hours, if separated from infant use breast massage, hand expression and/ or pumping to remove milk from breasts.   - Effective positioning and attachment    Comments: Helps my baby to get enough breast milk, helps to produce an adequate milk supply, and helps prevent nipple pain and damage   - Exclusive breastfeeding for the first 6 months    Comments: Builds a healthy milk supply and keeps it up, protects baby from sickness and disease, and breastmilk has everything  your baby needs for the first 6 months.     Assessment: 24 y.o. G2P1001 at [redacted]w[redacted]d by LMP presenting to initiate prenatal care  Plan: 1) Avoid alcoholic beverages. 2) Patient encouraged not to smoke.  3) Discontinue the use of all non-medicinal drugs and chemicals.  4) Take prenatal vitamins daily.  5) Nutrition, food safety (fish, cheese advisories, and high nitrite foods) and exercise discussed. 6) Hospital and practice style discussed with cross coverage system.  7) Genetic Screening, such as with 1st Trimester Screening, cell free fetal DNA, AFP testing, and Ultrasound, as well as with amniocentesis and CVS as appropriate, is discussed with patient. At the conclusion of today's visit patient declined genetic testing 8) Patient is asked about travel to areas at risk for the [place] virus, and counseled to avoid travel and exposure to mosquitoes or sexual partners who may have themselves been exposed to the virus. Testing is discussed, and will be ordered as appropriate.  9) PAPtima, urine culture today 10) Return to clinic in 1 week for anatomy/dating, NOB panel, sickle cell screen, rob   Rod Can, [facility] 05/24/2019, 12:06 PM

## 2019-05-24 NOTE — Progress Notes (Signed)
NOB N&V  Phineas Real for 1 appointment/see records

## 2019-05-25 ENCOUNTER — Encounter: Payer: Medicaid Other | Admitting: Certified Nurse Midwife

## 2019-05-26 LAB — URINE CULTURE: Organism ID, Bacteria: NO GROWTH

## 2019-05-26 LAB — CYTOLOGY - PAP
Chlamydia: NEGATIVE
Comment: NEGATIVE
Comment: NEGATIVE
Comment: NORMAL
Diagnosis: NEGATIVE
Neisseria Gonorrhea: NEGATIVE
Trichomonas: NEGATIVE

## 2019-06-02 ENCOUNTER — Other Ambulatory Visit: Payer: Self-pay

## 2019-06-02 ENCOUNTER — Other Ambulatory Visit: Payer: Self-pay | Admitting: Certified Nurse Midwife

## 2019-06-02 ENCOUNTER — Encounter: Payer: Self-pay | Admitting: Certified Nurse Midwife

## 2019-06-02 ENCOUNTER — Ambulatory Visit (INDEPENDENT_AMBULATORY_CARE_PROVIDER_SITE_OTHER): Payer: Medicaid Other

## 2019-06-02 ENCOUNTER — Ambulatory Visit (INDEPENDENT_AMBULATORY_CARE_PROVIDER_SITE_OTHER): Payer: Medicaid Other | Admitting: Certified Nurse Midwife

## 2019-06-02 VITALS — BP 110/76 | Wt 188.0 lb

## 2019-06-02 DIAGNOSIS — Z113 Encounter for screening for infections with a predominantly sexual mode of transmission: Secondary | ICD-10-CM | POA: Insufficient documentation

## 2019-06-02 DIAGNOSIS — Z3689 Encounter for other specified antenatal screening: Secondary | ICD-10-CM

## 2019-06-02 DIAGNOSIS — Z3480 Encounter for supervision of other normal pregnancy, unspecified trimester: Secondary | ICD-10-CM

## 2019-06-02 DIAGNOSIS — Z3482 Encounter for supervision of other normal pregnancy, second trimester: Secondary | ICD-10-CM

## 2019-06-02 DIAGNOSIS — Z3A19 19 weeks gestation of pregnancy: Secondary | ICD-10-CM

## 2019-06-02 DIAGNOSIS — Z363 Encounter for antenatal screening for malformations: Secondary | ICD-10-CM

## 2019-06-02 LAB — POCT URINALYSIS DIPSTICK OB
Glucose, UA: NEGATIVE
POC,PROTEIN,UA: NEGATIVE

## 2019-06-02 NOTE — Progress Notes (Signed)
ROB at 19wk1d: Doing well. ?feeling FM Anatomy scan today: CGA 19wk2d, anterior low lying placenta (1.6cm from os), incomplete anatomy scan for 3 VV, DA and fetal profile NOB labs today ROB and FU anatomy scan in 4 weeks.  Farrel Conners, CNM

## 2019-06-02 NOTE — Progress Notes (Signed)
No concerns.rj 

## 2019-06-05 LAB — RPR+RH+ABO+RUB AB+AB SCR+CB...
Antibody Screen: NEGATIVE
HIV Screen 4th Generation wRfx: NONREACTIVE
Hematocrit: 35.6 % (ref 34.0–46.6)
Hemoglobin: 12.5 g/dL (ref 11.1–15.9)
Hepatitis B Surface Ag: NEGATIVE
MCH: 31.7 pg (ref 26.6–33.0)
MCHC: 35.1 g/dL (ref 31.5–35.7)
MCV: 90 fL (ref 79–97)
Platelets: 240 10*3/uL (ref 150–450)
RBC: 3.94 x10E6/uL (ref 3.77–5.28)
RDW: 12.1 % (ref 11.7–15.4)
RPR Ser Ql: NONREACTIVE
Rh Factor: POSITIVE
Rubella Antibodies, IGG: 2.18 index (ref >0.99)
Varicella zoster IgG: 155 index — ABNORMAL LOW (ref >165)
WBC: 11 10*3/uL — ABNORMAL HIGH (ref 3.4–10.8)

## 2019-06-05 LAB — HEMOGLOBINOPATHY EVALUATION
HGB C: 0 %
HGB S: 0 %
HGB VARIANT: 0 %
Hemoglobin A2 Quantitation: 2.4 % (ref 1.8–3.2)
Hemoglobin F Quantitation: 0.6 % (ref 0.0–2.0)
Hgb A: 97 % (ref 96.4–98.8)

## 2019-06-30 ENCOUNTER — Other Ambulatory Visit: Payer: Self-pay

## 2019-06-30 ENCOUNTER — Ambulatory Visit (INDEPENDENT_AMBULATORY_CARE_PROVIDER_SITE_OTHER): Payer: Medicaid Other

## 2019-06-30 ENCOUNTER — Encounter: Payer: Self-pay | Admitting: Obstetrics & Gynecology

## 2019-06-30 ENCOUNTER — Ambulatory Visit (INDEPENDENT_AMBULATORY_CARE_PROVIDER_SITE_OTHER): Payer: Medicaid Other | Admitting: Obstetrics & Gynecology

## 2019-06-30 VITALS — BP 120/70 | Wt 192.0 lb

## 2019-06-30 DIAGNOSIS — O321XX0 Maternal care for breech presentation, not applicable or unspecified: Secondary | ICD-10-CM | POA: Diagnosis not present

## 2019-06-30 DIAGNOSIS — Z3482 Encounter for supervision of other normal pregnancy, second trimester: Secondary | ICD-10-CM

## 2019-06-30 DIAGNOSIS — Z3A23 23 weeks gestation of pregnancy: Secondary | ICD-10-CM

## 2019-06-30 DIAGNOSIS — Z3480 Encounter for supervision of other normal pregnancy, unspecified trimester: Secondary | ICD-10-CM

## 2019-06-30 DIAGNOSIS — Z362 Encounter for other antenatal screening follow-up: Secondary | ICD-10-CM

## 2019-06-30 DIAGNOSIS — Z131 Encounter for screening for diabetes mellitus: Secondary | ICD-10-CM

## 2019-06-30 DIAGNOSIS — Z363 Encounter for antenatal screening for malformations: Secondary | ICD-10-CM

## 2019-06-30 LAB — POCT URINALYSIS DIPSTICK OB
Glucose, UA: NEGATIVE
POC,PROTEIN,UA: NEGATIVE

## 2019-06-30 NOTE — Progress Notes (Signed)
  Subjective  Fetal Movement? yes Contractions? no Leaking Fluid? no Vaginal Bleeding? no Still has nausea and vomiting Objective  BP 120/70   Wt 192 lb (87.1 kg)   LMP 01/19/2019 (Exact Date)   BMI 30.99 kg/m  General: NAD Pumonary: no increased work of breathing Abdomen: gravid, non-tender Extremities: no edema Psychiatric: mood appropriate, affect full  Assessment  24 y.o. G2P1001 at [redacted]w[redacted]d by  10/26/2019, by Last Menstrual Period presenting for routine prenatal visit  Plan   Problem List Items Addressed This Visit      Other   Supervision of other normal pregnancy, antepartum    Other Visit Diagnoses    [redacted] weeks gestation of pregnancy    -  Primary   Relevant Orders   POC Urinalysis Dipstick OB (Completed)   Screening for diabetes mellitus       Relevant Orders   28 Week RH+Panel    Review of ULTRASOUND. I have personally reviewed images and report of recent ultrasound done at [facility]. There is a singleton gestation with subjectively normal amniotic fluid volume. The fetal biometry correlates with established dating. Detailed evaluation of the fetal anatomy was performed.The fetal anatomical survey appears within normal limits within the resolution of ultrasound as described above.  It must be noted that a normal ultrasound is unable to rule out fetal aneuploidy.   RESOLVED Low Lying Placenta  PNV, FMC Glucola nv  Annamarie Major, MD, [facility], [facility] 06/30/2019  11:06 AM

## 2019-06-30 NOTE — Patient Instructions (Signed)

## 2019-07-31 ENCOUNTER — Other Ambulatory Visit: Payer: Self-pay

## 2019-07-31 ENCOUNTER — Other Ambulatory Visit: Payer: Medicaid Other

## 2019-07-31 ENCOUNTER — Ambulatory Visit (INDEPENDENT_AMBULATORY_CARE_PROVIDER_SITE_OTHER): Payer: Medicaid Other | Admitting: Certified Nurse Midwife

## 2019-07-31 VITALS — BP 108/70 | Wt 196.0 lb

## 2019-07-31 DIAGNOSIS — Z3A27 27 weeks gestation of pregnancy: Secondary | ICD-10-CM

## 2019-07-31 DIAGNOSIS — Z3482 Encounter for supervision of other normal pregnancy, second trimester: Secondary | ICD-10-CM

## 2019-07-31 DIAGNOSIS — Z131 Encounter for screening for diabetes mellitus: Secondary | ICD-10-CM

## 2019-07-31 DIAGNOSIS — K439 Ventral hernia without obstruction or gangrene: Secondary | ICD-10-CM

## 2019-07-31 LAB — POCT URINALYSIS DIPSTICK OB
Glucose, UA: NEGATIVE
POC,PROTEIN,UA: NEGATIVE

## 2019-07-31 NOTE — Progress Notes (Signed)
C/O lump mid upper abdomen.

## 2019-07-31 NOTE — Progress Notes (Signed)
ROB at 27wk4d:HAving  28 week labs today.  Complains of a tender lump above her umbilicus that is more noticeable when she is up standing x 2 weeks. Baby active. Naming baby Zamire Wants to bottle feed.  EXam: 108/70 weight up 4# in 4 weeks; TWG 16# FH 29cm. FHT WNL Blood type A positive  3-4 cm Mass above umbilicus, very noticeable when standing; reduces easily when she lies down.  A: IUP at 27wk4d Probable ventral hernia  P: 28 week labs today Discussed probable diagnosis and treatment of ventral hernia. Warning signs of incarceration of hernia Encouraged use of a maternity support garment Will refer to surgeon to confirm diagnosis and discuss surgical treatment ROB in 3 weeks; discussed getting TDAP at that time.   Farrel Conners, CNM

## 2019-08-01 LAB — 28 WEEK RH+PANEL
Basophils Absolute: 0 10*3/uL (ref 0.0–0.2)
Basos: 0 %
EOS (ABSOLUTE): 0.1 10*3/uL (ref 0.0–0.4)
Eos: 1 %
Gestational Diabetes Screen: 74 mg/dL (ref 65–139)
HIV Screen 4th Generation wRfx: NONREACTIVE
Hematocrit: 32.9 % — ABNORMAL LOW (ref 34.0–46.6)
Hemoglobin: 10.7 g/dL — ABNORMAL LOW (ref 11.1–15.9)
Immature Grans (Abs): 0 10*3/uL (ref 0.0–0.1)
Immature Granulocytes: 0 %
Lymphocytes Absolute: 2 10*3/uL (ref 0.7–3.1)
Lymphs: 24 %
MCH: 30.8 pg (ref 26.6–33.0)
MCHC: 32.5 g/dL (ref 31.5–35.7)
MCV: 95 fL (ref 79–97)
Monocytes Absolute: 0.5 10*3/uL (ref 0.1–0.9)
Monocytes: 6 %
Neutrophils Absolute: 5.7 10*3/uL (ref 1.4–7.0)
Neutrophils: 69 %
Platelets: 231 10*3/uL (ref 150–450)
RBC: 3.47 x10E6/uL — ABNORMAL LOW (ref 3.77–5.28)
RDW: 12.4 % (ref 11.7–15.4)
RPR Ser Ql: NONREACTIVE
WBC: 8.4 10*3/uL (ref 3.4–10.8)

## 2019-08-08 ENCOUNTER — Encounter: Payer: Self-pay | Admitting: Obstetrics and Gynecology

## 2019-08-08 NOTE — Progress Notes (Signed)
Per Dr. Lemar Livings (msg sent via Secure chat while awaiting his paper notes)  "This patient should do fine w/ labor and delivery. I anticipate the uterus will cover the defect in 3-4 more weeks and it will be less visible. Glad to see early if needed, but I asked her to call after her 6 week post partum appt to arrange for repair. I have suggested that she take Miralax daily to help with her developing constipation."

## 2019-08-21 ENCOUNTER — Ambulatory Visit (INDEPENDENT_AMBULATORY_CARE_PROVIDER_SITE_OTHER): Payer: Medicaid Other | Admitting: Obstetrics and Gynecology

## 2019-08-21 ENCOUNTER — Encounter: Payer: Self-pay | Admitting: Obstetrics and Gynecology

## 2019-08-21 ENCOUNTER — Other Ambulatory Visit: Payer: Self-pay

## 2019-08-21 VITALS — BP 110/72 | Wt 202.0 lb

## 2019-08-21 DIAGNOSIS — Z3480 Encounter for supervision of other normal pregnancy, unspecified trimester: Secondary | ICD-10-CM

## 2019-08-21 DIAGNOSIS — Z3A30 30 weeks gestation of pregnancy: Secondary | ICD-10-CM

## 2019-08-21 DIAGNOSIS — O26843 Uterine size-date discrepancy, third trimester: Secondary | ICD-10-CM

## 2019-08-21 DIAGNOSIS — O219 Vomiting of pregnancy, unspecified: Secondary | ICD-10-CM

## 2019-08-21 LAB — POCT URINALYSIS DIPSTICK OB: Glucose, UA: NEGATIVE

## 2019-08-21 MED ORDER — PROMETHAZINE HCL 25 MG PO TABS: 25.0000 mg | ORAL_TABLET | Freq: Four times a day (QID) | ORAL | 3 refills | Status: DC | PRN

## 2019-08-21 MED ORDER — ONDANSETRON 4 MG PO TBDP: 4.0000 mg | ORAL_TABLET | Freq: Four times a day (QID) | ORAL | 3 refills | Status: DC | PRN

## 2019-08-21 NOTE — Patient Instructions (Addendum)
Initial steps to help :   B6 (pyridoxine) 25 mg,  3-4 times a day Unisom (doxylamine) 25 mg at bedtime **B6 and Unisom are available as a combination prescription medications called diclegis and bonjesta  B1 (thiamin)  50-100 mg 1-4 a day  Continue prenatal vitamin with iron and thiamin. If it is not tolerated switch to 1 mg of folic acid.  Can add medication for gastric reflux if needed.  Subsequent steps to be added to B1, B6, and Unisom:  1. Antihistamine (one of the following medications) Dramamine      25-50 mg every 4-6 hours Benadryl      25-50 mg every 4-6 hours Meclizine      25 mg every 6 hours  2. Dopamine Antagonist (one of the following medications) Metoclopramide  (Reglan)  5-10 mg every 6-8 hours         PO Promethazine   (Phenergan)   12.5-25 mg every 4-6 hours      PO or rectal Prochlorperazine  (Compazine)  5-10 mg every 6-8 hours     25mg  BID rectally    Subsequent steps if there has still not been improvement in symptoms:  3. Daily stool softner  4. Ondansetron  (Zofran)   4-8 mg every 6-8 hours     Third Trimester of Pregnancy The third trimester is from week 28 through week 40 (months 7 through 9). The third trimester is a time when the unborn baby (fetus) is growing rapidly. At the end of the ninth month, the fetus is about 20 inches in length and weighs 6-10 pounds. Body changes during your third trimester Your body will continue to go through many changes during pregnancy. The changes vary from woman to woman. During the third trimester:  Your weight will continue to increase. You can expect to gain 25-35 pounds (11-16 kg) by the end of the pregnancy.  You may begin to get stretch marks on your hips, abdomen, and breasts.  You may urinate more often because the fetus is moving lower into your pelvis and pressing on your bladder.  You may develop or continue to have heartburn. This is caused by increased hormones that slow down muscles in the  digestive tract.  You may develop or continue to have constipation because increased hormones slow digestion and cause the muscles that push waste through your intestines to relax.  You may develop hemorrhoids. These are swollen veins (varicose veins) in the rectum that can itch or be painful.  You may develop swollen, bulging veins (varicose veins) in your legs.  You may have increased body aches in the pelvis, back, or thighs. This is due to weight gain and increased hormones that are relaxing your joints.  You may have changes in your hair. These can include thickening of your hair, rapid growth, and changes in texture. Some women also have hair loss during or after pregnancy, or hair that feels dry or thin. Your hair will most likely return to normal after your baby is born.  Your breasts will continue to grow and they will continue to become tender. A yellow fluid (colostrum) may leak from your breasts. This is the first milk you are producing for your baby.  Your belly button may stick out.  You may notice more swelling in your hands, face, or ankles.  You may have increased tingling or numbness in your hands, arms, and legs. The skin on your belly may also feel numb.  You may feel short of  breath because of your expanding uterus.  You may have more problems sleeping. This can be caused by the size of your belly, increased need to urinate, and an increase in your body's metabolism.  You may notice the fetus "dropping," or moving lower in your abdomen (lightening).  You may have increased vaginal discharge.  You may notice your joints feel loose and you may have pain around your pelvic bone. What to expect at prenatal visits You will have prenatal exams every 2 weeks until week 36. Then you will have weekly prenatal exams. During a routine prenatal visit:  You will be weighed to make sure you and the baby are growing normally.  Your blood pressure will be taken.  Your abdomen  will be measured to track your baby's growth.  The fetal heartbeat will be listened to.  Any test results from the previous visit will be discussed.  You may have a cervical check near your due date to see if your cervix has softened or thinned (effaced).  You will be tested for Group B streptococcus. This happens between 35 and 37 weeks. Your health care provider may ask you:  What your birth plan is.  How you are feeling.  If you are feeling the baby move.  If you have had any abnormal symptoms, such as leaking fluid, bleeding, severe headaches, or abdominal cramping.  If you are using any tobacco products, including cigarettes, chewing tobacco, and electronic cigarettes.  If you have any questions. Other tests or screenings that may be performed during your third trimester include:  Blood tests that check for low iron levels (anemia).  Fetal testing to check the health, activity level, and growth of the fetus. Testing is done if you have certain medical conditions or if there are problems during the pregnancy.  Nonstress test (NST). This test checks the health of your baby to make sure there are no signs of problems, such as the baby not getting enough oxygen. During this test, a belt is placed around your belly. The baby is made to move, and its heart rate is monitored during movement. What is false labor? False labor is a condition in which you feel small, irregular tightenings of the muscles in the womb (contractions) that usually go away with rest, changing position, or drinking water. These are called Braxton Hicks contractions. Contractions may last for hours, days, or even weeks before true labor sets in. If contractions come at regular intervals, become more frequent, increase in intensity, or become painful, you should see your health care provider. What are the signs of labor?  Abdominal cramps.  Regular contractions that start at 10 minutes apart and become stronger  and more frequent with time.  Contractions that start on the top of the uterus and spread down to the lower abdomen and back.  Increased pelvic pressure and dull back pain.  A watery or bloody mucus discharge that comes from the vagina.  Leaking of amniotic fluid. This is also known as your "water breaking." It could be a slow trickle or a gush. Let your health care provider know if it has a color or strange odor. If you have any of these signs, call your health care provider right away, even if it is before your due date. Follow these instructions at home: Medicines  Follow your health care provider's instructions regarding medicine use. Specific medicines may be either safe or unsafe to take during pregnancy.  Take a prenatal vitamin that contains at least  600 micrograms (mcg) of folic acid.  If you develop constipation, try taking a stool softener if your health care provider approves. Eating and drinking   Eat a balanced diet that includes fresh fruits and vegetables, whole grains, good sources of protein such as meat, eggs, or tofu, and low-fat dairy. Your health care provider will help you determine the amount of weight gain that is right for you.  Avoid raw meat and uncooked cheese. These carry germs that can cause birth defects in the baby.  If you have low calcium intake from food, talk to your health care provider about whether you should take a daily calcium supplement.  Eat four or five small meals rather than three large meals a day.  Limit foods that are high in fat and processed sugars, such as fried and sweet foods.  To prevent constipation: ? Drink enough fluid to keep your urine clear or pale yellow. ? Eat foods that are high in fiber, such as fresh fruits and vegetables, whole grains, and beans. Activity  Exercise only as directed by your health care provider. Most women can continue their usual exercise routine during pregnancy. Try to exercise for 30 minutes  at least 5 days a week. Stop exercising if you experience uterine contractions.  Avoid heavy lifting.  Do not exercise in extreme heat or humidity, or at high altitudes.  Wear low-heel, comfortable shoes.  Practice good posture.  You may continue to have sex unless your health care provider tells you otherwise. Relieving pain and discomfort  Take frequent breaks and rest with your legs elevated if you have leg cramps or low back pain.  Take warm sitz baths to soothe any pain or discomfort caused by hemorrhoids. Use hemorrhoid cream if your health care provider approves.  Wear a good support bra to prevent discomfort from breast tenderness.  If you develop varicose veins: ? Wear support pantyhose or compression stockings as told by your healthcare provider. ? Elevate your feet for 15 minutes, 3-4 times a day. Prenatal care  Write down your questions. Take them to your prenatal visits.  Keep all your prenatal visits as told by your health care provider. This is important. Safety  Wear your seat belt at all times when driving.  Make a list of emergency phone numbers, including numbers for family, friends, the hospital, and police and fire departments. General instructions  Avoid cat litter boxes and soil used by cats. These carry germs that can cause birth defects in the baby. If you have a cat, ask someone to clean the litter box for you.  Do not travel far distances unless it is absolutely necessary and only with the approval of your health care provider.  Do not use hot tubs, steam rooms, or saunas.  Do not drink alcohol.  Do not use any products that contain nicotine or tobacco, such as cigarettes and e-cigarettes. If you need help quitting, ask your health care provider.  Do not use any medicinal herbs or unprescribed drugs. These chemicals affect the formation and growth of the baby.  Do not douche or use tampons or scented sanitary pads.  Do not cross your legs for  long periods of time.  To prepare for the arrival of your baby: ? Take prenatal classes to understand, practice, and ask questions about labor and delivery. ? Make a trial run to the hospital. ? Visit the hospital and tour the maternity area. ? Arrange for maternity or paternity leave through employers. ? Arrange  for family and friends to take care of pets while you are in the hospital. ? Purchase a rear-facing car seat and make sure you know how to install it in your car. ? Pack your hospital bag. ? Prepare the baby's nursery. Make sure to remove all pillows and stuffed animals from the baby's crib to prevent suffocation.  Visit your dentist if you have not gone during your pregnancy. Use a soft toothbrush to brush your teeth and be gentle when you floss. Contact a health care provider if:  You are unsure if you are in labor or if your water has broken.  You become dizzy.  You have mild pelvic cramps, pelvic pressure, or nagging pain in your abdominal area.  You have lower back pain.  You have persistent nausea, vomiting, or diarrhea.  You have an unusual or bad smelling vaginal discharge.  You have pain when you urinate. Get help right away if:  Your water breaks before 37 weeks.  You have regular contractions less than 5 minutes apart before 37 weeks.  You have a fever.  You are leaking fluid from your vagina.  You have spotting or bleeding from your vagina.  You have severe abdominal pain or cramping.  You have rapid weight loss or weight gain.  You have shortness of breath with chest pain.  You notice sudden or extreme swelling of your face, hands, ankles, feet, or legs.  Your baby makes fewer than 10 movements in 2 hours.  You have severe headaches that do not go away when you take medicine.  You have vision changes. Summary  The third trimester is from week 28 through week 40, months 7 through 9. The third trimester is a time when the unborn baby (fetus)  is growing rapidly.  During the third trimester, your discomfort may increase as you and your baby continue to gain weight. You may have abdominal, leg, and back pain, sleeping problems, and an increased need to urinate.  During the third trimester your breasts will keep growing and they will continue to become tender. A yellow fluid (colostrum) may leak from your breasts. This is the first milk you are producing for your baby.  False labor is a condition in which you feel small, irregular tightenings of the muscles in the womb (contractions) that eventually go away. These are called Braxton Hicks contractions. Contractions may last for hours, days, or even weeks before true labor sets in.  Signs of labor can include: abdominal cramps; regular contractions that start at 10 minutes apart and become stronger and more frequent with time; watery or bloody mucus discharge that comes from the vagina; increased pelvic pressure and dull back pain; and leaking of amniotic fluid. This information is not intended to replace advice given to you by your health care provider. Make sure you discuss any questions you have with your health care provider. Document Revised: 07/14/2018 Document Reviewed: 04/28/2016 Elsevier Patient Education  2020 [employer]    Iron-Rich Diet  Iron is a mineral that helps your body to produce hemoglobin. Hemoglobin is a protein in red blood cells that carries oxygen to your body's tissues. Eating too little iron may cause you to feel weak and tired, and it can increase your risk of infection. Iron is naturally found in many foods, and many foods have iron added to them (iron-fortified foods). You may need to follow an iron-rich diet if you do not have enough iron in your body due to certain medical conditions.  The amount of iron that you need each day depends on your age, your sex, and any medical conditions you have. Follow instructions from your health care provider or a diet  and nutrition specialist (dietitian) about how much iron you should eat each day. What are tips for following this plan? Reading food labels  Check food labels to see how many milligrams (mg) of iron are in each serving. Cooking  Cook foods in pots and pans that are made from iron.  Take these steps to make it easier for your body to absorb iron from certain foods: ? Soak beans overnight before cooking. ? Soak whole grains overnight and drain them before using. ? Ferment flours before baking, such as by using yeast in bread dough. Meal planning  When you eat foods that contain iron, you should eat them with foods that are high in vitamin C. These include oranges, peppers, tomatoes, potatoes, and mango. Vitamin C helps your body to absorb iron. General information  Take iron supplements only as told by your health care provider. An overdose of iron can be life-threatening. If you were prescribed iron supplements, take them with orange juice or a vitamin C supplement.  When you eat iron-fortified foods or take an iron supplement, you should also eat foods that naturally contain iron, such as meat, poultry, and fish. Eating naturally iron-rich foods helps your body to absorb the iron that is added to other foods or contained in a supplement.  Certain foods and drinks prevent your body from absorbing iron properly. Avoid eating these foods in the same meal as iron-rich foods or with iron supplements. These foods include: ? Coffee, black tea, and red wine. ? Milk, dairy products, and foods that are high in calcium. ? Beans and soybeans. ? Whole grains. What foods should I eat? Fruits Prunes. Raisins. Eat fruits high in vitamin C, such as oranges, grapefruits, and strawberries, alongside iron-rich foods. Vegetables Spinach (cooked). Green peas. Broccoli. Fermented vegetables. Eat vegetables high in vitamin C, such as leafy greens, potatoes, bell peppers, and tomatoes, alongside iron-rich  foods. Grains Iron-fortified breakfast cereal. Iron-fortified whole-wheat bread. Enriched rice. Sprouted grains. Meats and other proteins Beef liver. Oysters. Beef. Shrimp. [place]. Chicken. [place]. Sardines. Chickpeas. Nuts. Tofu. Pumpkin seeds. Beverages Tomato juice. Fresh orange juice. Prune juice. Hibiscus tea. Fortified instant breakfast shakes. Sweets and desserts Blackstrap molasses. Seasonings and condiments Tahini. Fermented soy sauce. Other foods Wheat germ. The items listed above may not be a complete list of recommended foods and beverages. Contact a dietitian for more information. What foods should I avoid? Grains Whole grains. Bran cereal. Bran flour. Oats. Meats and other proteins Soybeans. Products made from soy protein. Black beans. Lentils. Mung beans. Split peas. Dairy Milk. Cream. Cheese. Yogurt. Cottage cheese. Beverages Coffee. Black tea. Red wine. Sweets and desserts Cocoa. Chocolate. Ice cream. Other foods Basil. Oregano. Large amounts of parsley. The items listed above may not be a complete list of foods and beverages to avoid. Contact a dietitian for more information. Summary  Iron is a mineral that helps your body to produce hemoglobin. Hemoglobin is a protein in red blood cells that carries oxygen to your body's tissues.  Iron is naturally found in many foods, and many foods have iron added to them (iron-fortified foods).  When you eat foods that contain iron, you should eat them with foods that are high in vitamin C. Vitamin C helps your body to absorb iron.  Certain foods and drinks prevent your body  from absorbing iron properly, such as whole grains and dairy products. You should avoid eating these foods in the same meal as iron-rich foods or with iron supplements. This information is not intended to replace advice given to you by your health care provider. Make sure you discuss any questions you have with your health care provider. Document Revised:  03/05/2017 Document Reviewed: 02/16/2017 Elsevier Patient Education  2020 [employer].

## 2019-08-21 NOTE — Progress Notes (Signed)
    Routine Prenatal Care Visit  Subjective  Chelsea Mcpherson is a 24 y.o. G2P1001 at [redacted]w[redacted]d being seen today for ongoing prenatal care.  She is currently monitored for the following issues for this low-risk pregnancy and has Supervision of other normal pregnancy, antepartum and Ventral hernia without obstruction or gangrene on their problem list.  ----------------------------------------------------------------------------------- Patient reports vomiting.   Contractions: Not present. Vag. Bleeding: None.  Movement: Present. Denies leaking of fluid.  ----------------------------------------------------------------------------------- The following portions of the patient's history were reviewed and updated as appropriate: allergies, current medications, past family history, past medical history, past social history, past surgical history and problem list. Problem list updated.   Objective  Blood pressure 110/72, weight 202 lb (91.6 kg), last menstrual period 01/19/2019. Pregravid weight 180 lb (81.6 kg) Total Weight Gain 22 lb (9.979 kg) Urinalysis:      Fetal Status: Fetal Heart Rate (bpm): 150 Fundal Height: 34 cm Movement: Present     General:  Alert, oriented and cooperative. Patient is in no acute distress.  Skin: Skin is warm and dry. No rash noted.   Cardiovascular: Normal heart rate noted  Respiratory: Normal respiratory effort, no problems with respiration noted  Abdomen: Soft, gravid, appropriate for gestational age. Pain/Pressure: Absent     Pelvic:  Cervical exam deferred        Extremities: Normal range of motion.     Mental Status: Normal mood and affect. Normal behavior. Normal judgment and thought content.     Assessment   24 y.o. G2P1001 at [redacted]w[redacted]d by  10/26/2019, by Last Menstrual Period presenting for routine prenatal visit  Plan   Pregnancy #2 Problems (from 01/19/19 to present)    Problem Noted Resolved   Supervision of other normal pregnancy, antepartum  05/24/2019 by Tresea Mall, CNM No   Overview Addendum 08/21/2019  8:23 AM by Natale Milch, MD    [facility]inic [facility] Prenatal Labs  Dating No early u/s Blood type: A/Positive/-- (02/26 1203)   Genetic Screen  declined all Antibody:Negative (02/26 1203)  Anatomic [place] complete Rubella: 2.18 (02/26 1203)  Varicella: Equivocal  GTT 28 wk: 74 RPR: Non Reactive (04/26 0947)   Rhogam  not needed HBsAg: Negative (02/26 1203)   Vaccines TDAP:                       Flu Shot: declined HIV: Non Reactive (04/26 0947)   Baby Food Wants to try breastfeeding                               GBS:   Contraception  Pap: uncertain last PAP date PAP done 05/24/19  CBB     CS/VBAC NA   Support Person Partner Mozarrod            Discussed management of hyperemesis Discussed TDAP and COVID vaccination Discussed breastfeeding classes Discussed anemia. Patient taking OTC iron.   Gestational age appropriate obstetric precautions including but not limited to vaginal bleeding, contractions, leaking of fluid and fetal movement were reviewed in detail with the patient.    Return in 2 weeks (on 09/04/2019) for needs TDAP- ROB in person and growth/afi [place].  Natale Milch MD [facility], [facility] 08/21/2019, 8:42 AM

## 2019-09-05 ENCOUNTER — Other Ambulatory Visit: Payer: Self-pay

## 2019-09-05 ENCOUNTER — Encounter: Payer: Self-pay | Admitting: Advanced Practice Midwife

## 2019-09-05 ENCOUNTER — Ambulatory Visit (INDEPENDENT_AMBULATORY_CARE_PROVIDER_SITE_OTHER): Payer: Medicaid Other

## 2019-09-05 ENCOUNTER — Ambulatory Visit (INDEPENDENT_AMBULATORY_CARE_PROVIDER_SITE_OTHER): Payer: Medicaid Other | Admitting: Advanced Practice Midwife

## 2019-09-05 VITALS — BP 118/74 | Wt 205.0 lb

## 2019-09-05 DIAGNOSIS — Z3A33 33 weeks gestation of pregnancy: Secondary | ICD-10-CM | POA: Diagnosis not present

## 2019-09-05 DIAGNOSIS — O26843 Uterine size-date discrepancy, third trimester: Secondary | ICD-10-CM | POA: Diagnosis not present

## 2019-09-05 DIAGNOSIS — Z23 Encounter for immunization: Secondary | ICD-10-CM | POA: Diagnosis not present

## 2019-09-05 DIAGNOSIS — Z3483 Encounter for supervision of other normal pregnancy, third trimester: Secondary | ICD-10-CM

## 2019-09-05 DIAGNOSIS — Z3A32 32 weeks gestation of pregnancy: Secondary | ICD-10-CM

## 2019-09-05 NOTE — Progress Notes (Signed)
U/s today. No vb. No lof.  

## 2019-09-05 NOTE — Progress Notes (Signed)
  Routine Prenatal Care Visit  Subjective  Chelsea Mcpherson is a 24 y.o. G2P1001 at [redacted]w[redacted]d being seen today for ongoing prenatal care.  She is currently monitored for the following issues for this low-risk pregnancy and has Supervision of other normal pregnancy, antepartum and Ventral hernia without obstruction or gangrene on their problem list.  ----------------------------------------------------------------------------------- Patient reports some lower bilateral abdominal pain and tenderness at site of hernia.   Contractions: Not present. Vag. Bleeding: None.  Movement: Present. Leaking Fluid denies.  ----------------------------------------------------------------------------------- The following portions of the patient's history were reviewed and updated as appropriate: allergies, current medications, past family history, past medical history, past social history, past surgical history and problem list. Problem list updated.  Objective  Blood pressure 118/74, weight 205 lb (93 kg), last menstrual period 01/19/2019. Pregravid weight 180 lb (81.6 kg) Total Weight Gain 25 lb (11.3 kg) Urinalysis: Urine Protein    Urine Glucose    Fetal Status: Fetal Heart Rate (bpm): 158 Fundal Height: 32 cm Movement: Present  Presentation: Vertex   Growth: 46.9%, AC 31.1%, 4 pounds 8 ounces, AFI 15.7 cm  General:  Alert, oriented and cooperative. Patient is in no acute distress.  Skin: Skin is warm and dry. No rash noted.   Cardiovascular: Normal heart rate noted  Respiratory: Normal respiratory effort, no problems with respiration noted  Abdomen: Soft, gravid, appropriate for gestational age. Pain/Pressure: Absent     Pelvic:  Cervical exam deferred        Extremities: Normal range of motion.  Edema: None  Mental Status: Normal mood and affect. Normal behavior. Normal judgment and thought content.   Assessment   24 y.o. G2P1001 at [redacted]w[redacted]d by  10/26/2019, by Last Menstrual Period presenting for  routine prenatal visit  Plan   Pregnancy #2 Problems (from 01/19/19 to present)    Problem Noted Resolved   Supervision of other normal pregnancy, antepartum 05/24/2019 by Tresea Mall, CNM No   Overview Addendum 08/21/2019  8:23 AM by Natale Milch, MD    Clinic [facility] Prenatal Labs  Dating No early u/s Blood type: A/Positive/-- (02/26 1203)   Genetic Screen  declined all Antibody:Negative (02/26 1203)  Anatomic [place] complete Rubella: 2.18 (02/26 1203)  Varicella: Equivocal  GTT 28 wk: 74 RPR: Non Reactive (04/26 0947)   Rhogam  not needed HBsAg: Negative (02/26 1203)   Vaccines TDAP: 09/05/19                      Flu Shot: declined HIV: Non Reactive (04/26 0947)   Baby Food Wants to try breastfeeding                               GBS:   Contraception  Pap: uncertain last PAP date PAP done 05/24/19  CBB     CS/VBAC NA   Support Person Partner Mozarrod           Round ligament pain/hernia tenderness: abdominal support band, soak in tub, heat to ligament pain, stay hydrated   Preterm labor symptoms and general obstetric precautions including but not limited to vaginal bleeding, contractions, leaking of fluid and fetal movement were reviewed in detail with the patient. Please refer to After Visit Summary for other counseling recommendations.   Return in about 2 weeks (around 09/19/2019) for rob.  Tresea Mall, CNM 09/05/2019 9:45 AM

## 2019-09-05 NOTE — Addendum Note (Signed)
Addended by: Liliane Shi on: 09/05/2019 10:02 AM   Modules accepted: Orders

## 2019-09-19 ENCOUNTER — Ambulatory Visit (INDEPENDENT_AMBULATORY_CARE_PROVIDER_SITE_OTHER): Payer: Medicaid Other | Admitting: Obstetrics and Gynecology

## 2019-09-19 ENCOUNTER — Other Ambulatory Visit: Payer: Self-pay

## 2019-09-19 ENCOUNTER — Encounter: Payer: Self-pay | Admitting: Obstetrics and Gynecology

## 2019-09-19 VITALS — BP 120/72 | Ht 67.0 in | Wt 205.4 lb

## 2019-09-19 DIAGNOSIS — Z3A34 34 weeks gestation of pregnancy: Secondary | ICD-10-CM

## 2019-09-19 DIAGNOSIS — Z1159 Encounter for screening for other viral diseases: Secondary | ICD-10-CM

## 2019-09-19 DIAGNOSIS — Z3480 Encounter for supervision of other normal pregnancy, unspecified trimester: Secondary | ICD-10-CM

## 2019-09-19 DIAGNOSIS — O99013 Anemia complicating pregnancy, third trimester: Secondary | ICD-10-CM

## 2019-09-19 LAB — POCT URINALYSIS DIPSTICK OB
Glucose, UA: NEGATIVE
POC,PROTEIN,UA: NEGATIVE

## 2019-09-19 NOTE — Patient Instructions (Addendum)
Third Trimester of Pregnancy The third trimester is from week 28 through week 40 (months 7 through 9). The third trimester is a time when the unborn baby (fetus) is growing rapidly. At the end of the ninth month, the fetus is about 20 inches in length and weighs 6-10 pounds. Body changes during your third trimester Your body will continue to go through many changes during pregnancy. The changes vary from woman to woman. During the third trimester:  Your weight will continue to increase. You can expect to gain 25-35 pounds (11-16 kg) by the end of the pregnancy.  You may begin to get stretch marks on your hips, abdomen, and breasts.  You may urinate more often because the fetus is moving lower into your pelvis and pressing on your bladder.  You may develop or continue to have heartburn. This is caused by increased hormones that slow down muscles in the digestive tract.  You may develop or continue to have constipation because increased hormones slow digestion and cause the muscles that push waste through your intestines to relax.  You may develop hemorrhoids. These are swollen veins (varicose veins) in the rectum that can itch or be painful.  You may develop swollen, bulging veins (varicose veins) in your legs.  You may have increased body aches in the pelvis, back, or thighs. This is due to weight gain and increased hormones that are relaxing your joints.  You may have changes in your hair. These can include thickening of your hair, rapid growth, and changes in texture. Some women also have hair loss during or after pregnancy, or hair that feels dry or thin. Your hair will most likely return to normal after your baby is born.  Your breasts will continue to grow and they will continue to become tender. A yellow fluid (colostrum) may leak from your breasts. This is the first milk you are producing for your baby.  Your belly button may stick out.  You may notice more swelling in your hands,  face, or ankles.  You may have increased tingling or numbness in your hands, arms, and legs. The skin on your belly may also feel numb.  You may feel short of breath because of your expanding uterus.  You may have more problems sleeping. This can be caused by the size of your belly, increased need to urinate, and an increase in your body's metabolism.  You may notice the fetus "dropping," or moving lower in your abdomen (lightening).  You may have increased vaginal discharge.  You may notice your joints feel loose and you may have pain around your pelvic bone. What to expect at prenatal visits You will have prenatal exams every 2 weeks until week 36. Then you will have weekly prenatal exams. During a routine prenatal visit:  You will be weighed to make sure you and the baby are growing normally.  Your blood pressure will be taken.  Your abdomen will be measured to track your baby's growth.  The fetal heartbeat will be listened to.  Any test results from the previous visit will be discussed.  You may have a cervical check near your due date to see if your cervix has softened or thinned (effaced).  You will be tested for Group B streptococcus. This happens between 35 and 37 weeks. Your health care provider may ask you:  What your birth plan is.  How you are feeling.  If you are feeling the baby move.  If you have had any abnormal   symptoms, such as leaking fluid, bleeding, severe headaches, or abdominal cramping.  If you are using any tobacco products, including cigarettes, chewing tobacco, and electronic cigarettes.  If you have any questions. Other tests or screenings that may be performed during your third trimester include:  Blood tests that check for low iron levels (anemia).  Fetal testing to check the health, activity level, and growth of the fetus. Testing is done if you have certain medical conditions or if there are problems during the pregnancy.  Nonstress test  (NST). This test checks the health of your baby to make sure there are no signs of problems, such as the baby not getting enough oxygen. During this test, a belt is placed around your belly. The baby is made to move, and its heart rate is monitored during movement. What is false labor? False labor is a condition in which you feel small, irregular tightenings of the muscles in the womb (contractions) that usually go away with rest, changing position, or drinking water. These are called Braxton Hicks contractions. Contractions may last for hours, days, or even weeks before true labor sets in. If contractions come at regular intervals, become more frequent, increase in intensity, or become painful, you should see your health care provider. What are the signs of labor?  Abdominal cramps.  Regular contractions that start at 10 minutes apart and become stronger and more frequent with time.  Contractions that start on the top of the uterus and spread down to the lower abdomen and back.  Increased pelvic pressure and dull back pain.  A watery or bloody mucus discharge that comes from the vagina.  Leaking of amniotic fluid. This is also known as your "water breaking." It could be a slow trickle or a gush. Let your health care provider know if it has a color or strange odor. If you have any of these signs, call your health care provider right away, even if it is before your due date. Follow these instructions at home: Medicines  Follow your health care provider's instructions regarding medicine use. Specific medicines may be either safe or unsafe to take during pregnancy.  Take a prenatal vitamin that contains at least 600 micrograms (mcg) of folic acid.  If you develop constipation, try taking a stool softener if your health care provider approves. Eating and drinking   Eat a balanced diet that includes fresh fruits and vegetables, whole grains, good sources of protein such as meat, eggs, or tofu,  and low-fat dairy. Your health care provider will help you determine the amount of weight gain that is right for you.  Avoid raw meat and uncooked cheese. These carry germs that can cause birth defects in the baby.  If you have low calcium intake from food, talk to your health care provider about whether you should take a daily calcium supplement.  Eat four or five small meals rather than three large meals a day.  Limit foods that are high in fat and processed sugars, such as fried and sweet foods.  To prevent constipation: ? Drink enough fluid to keep your urine clear or pale yellow. ? Eat foods that are high in fiber, such as fresh fruits and vegetables, whole grains, and beans. Activity  Exercise only as directed by your health care provider. Most women can continue their usual exercise routine during pregnancy. Try to exercise for 30 minutes at least 5 days a week. Stop exercising if you experience uterine contractions.  Avoid heavy lifting.  Do   not exercise in extreme heat or humidity, or at high altitudes.  Wear low-heel, comfortable shoes.  Practice good posture.  You may continue to have sex unless your health care provider tells you otherwise. Relieving pain and discomfort  Take frequent breaks and rest with your legs elevated if you have leg cramps or low back pain.  Take warm sitz baths to soothe any pain or discomfort caused by hemorrhoids. Use hemorrhoid cream if your health care provider approves.  Wear a good support bra to prevent discomfort from breast tenderness.  If you develop varicose veins: ? Wear support pantyhose or compression stockings as told by your healthcare provider. ? Elevate your feet for 15 minutes, 3-4 times a day. Prenatal care  Write down your questions. Take them to your prenatal visits.  Keep all your prenatal visits as told by your health care provider. This is important. Safety  Wear your seat belt at all times when driving.  Make  a list of emergency phone numbers, including numbers for family, friends, the hospital, and police and fire departments. General instructions  Avoid cat litter boxes and soil used by cats. These carry germs that can cause birth defects in the baby. If you have a cat, ask someone to clean the litter box for you.  Do not travel far distances unless it is absolutely necessary and only with the approval of your health care provider.  Do not use hot tubs, steam rooms, or saunas.  Do not drink alcohol.  Do not use any products that contain nicotine or tobacco, such as cigarettes and e-cigarettes. If you need help quitting, ask your health care provider.  Do not use any medicinal herbs or unprescribed drugs. These chemicals affect the formation and growth of the baby.  Do not douche or use tampons or scented sanitary pads.  Do not cross your legs for long periods of time.  To prepare for the arrival of your baby: ? Take prenatal classes to understand, practice, and ask questions about labor and delivery. ? Make a trial run to the hospital. ? Visit the hospital and tour the maternity area. ? Arrange for maternity or paternity leave through employers. ? Arrange for family and friends to take care of pets while you are in the hospital. ? Purchase a rear-facing car seat and make sure you know how to install it in your car. ? Pack your hospital bag. ? Prepare the baby's nursery. Make sure to remove all pillows and stuffed animals from the baby's crib to prevent suffocation.  Visit your dentist if you have not gone during your pregnancy. Use a soft toothbrush to brush your teeth and be gentle when you floss. Contact a health care provider if:  You are unsure if you are in labor or if your water has broken.  You become dizzy.  You have mild pelvic cramps, pelvic pressure, or nagging pain in your abdominal area.  You have lower back pain.  You have persistent nausea, vomiting, or  diarrhea.  You have an unusual or bad smelling vaginal discharge.  You have pain when you urinate. Get help right away if:  Your water breaks before 37 weeks.  You have regular contractions less than 5 minutes apart before 37 weeks.  You have a fever.  You are leaking fluid from your vagina.  You have spotting or bleeding from your vagina.  You have severe abdominal pain or cramping.  You have rapid weight loss or weight gain.  You have   shortness of breath with chest pain.  You notice sudden or extreme swelling of your face, hands, ankles, feet, or legs.  Your baby makes fewer than 10 movements in 2 hours.  You have severe headaches that do not go away when you take medicine.  You have vision changes. Summary  The third trimester is from week 28 through week 40, months 7 through 9. The third trimester is a time when the unborn baby (fetus) is growing rapidly.  During the third trimester, your discomfort may increase as you and your baby continue to gain weight. You may have abdominal, leg, and back pain, sleeping problems, and an increased need to urinate.  During the third trimester your breasts will keep growing and they will continue to become tender. A yellow fluid (colostrum) may leak from your breasts. This is the first milk you are producing for your baby.  False labor is a condition in which you feel small, irregular tightenings of the muscles in the womb (contractions) that eventually go away. These are called Braxton Hicks contractions. Contractions may last for hours, days, or even weeks before true labor sets in.  Signs of labor can include: abdominal cramps; regular contractions that start at 10 minutes apart and become stronger and more frequent with time; watery or bloody mucus discharge that comes from the vagina; increased pelvic pressure and dull back pain; and leaking of amniotic fluid. This information is not intended to replace advice given to you by your  health care provider. Make sure you discuss any questions you have with your health care provider. Document Revised: 07/14/2018 Document Reviewed: 04/28/2016 Elsevier Patient Education  2020 [place].   Vaginal Delivery  Vaginal delivery means that you give birth by pushing your baby out of your birth canal (vagina). A team of health care providers will help you before, during, and after vaginal delivery. Birth experiences are unique for every woman and every pregnancy, and birth experiences vary depending on where you choose to give birth. What happens when I arrive at the birth center or hospital? Once you are in labor and have been admitted into the hospital or birth center, your health care provider may:  Review your pregnancy history and any concerns that you have.  Insert an IV into one of your veins. This may be used to give you fluids and medicines.  Check your blood pressure, pulse, temperature, and heart rate (vital signs).  Check whether your bag of water (amniotic sac) has broken (ruptured).  Talk with you about your birth plan and discuss pain control options. Monitoring Your health care provider may monitor your contractions (uterine monitoring) and your baby's heart rate (fetal monitoring). You may need to be monitored:  Often, but not continuously (intermittently).  All the time or for long periods at a time (continuously). Continuous monitoring may be needed if: ? You are taking certain medicines, such as medicine to relieve pain or make your contractions stronger. ? You have pregnancy or labor complications. Monitoring may be done by:  Placing a special stethoscope or a handheld monitoring device on your abdomen to check your baby's heartbeat and to check for contractions.  Placing monitors on your abdomen (external monitors) to record your baby's heartbeat and the frequency and length of contractions.  Placing monitors inside your uterus through your vagina  (internal monitors) to record your baby's heartbeat and the frequency, length, and strength of your contractions. Depending on the type of monitor, it may remain in your uterus  or on your baby's head until birth.  Telemetry. This is a type of continuous monitoring that can be done with external or internal monitors. Instead of having to stay in bed, you are able to move around during telemetry. Physical exam Your health care provider may perform frequent physical exams. This may include:  Checking how and where your baby is positioned in your uterus.  Checking your cervix to determine: ? Whether it is thinning out (effacing). ? Whether it is opening up (dilating). What happens during labor and delivery?  Normal labor and delivery is divided into the following three stages: Stage 1  This is the longest stage of labor.  This stage can last for hours or days.  Throughout this stage, you will feel contractions. Contractions generally feel mild, infrequent, and irregular at first. They get stronger, more frequent (about every 2-3 minutes), and more regular as you move through this stage.  This stage ends when your cervix is completely dilated to 4 inches (10 cm) and completely effaced. Stage 2  This stage starts once your cervix is completely effaced and dilated and lasts until the delivery of your baby.  This stage may last from 20 minutes to 2 hours.  This is the stage where you will feel an urge to push your baby out of your vagina.  You may feel stretching and burning pain, especially when the widest part of your baby's head passes through the vaginal opening (crowning).  Once your baby is delivered, the umbilical cord will be clamped and cut. This usually occurs after waiting a period of 1-2 minutes after delivery.  Your baby will be placed on your bare chest (skin-to-skin contact) in an upright position and covered with a warm blanket. Watch your baby for feeding cues, like  rooting or sucking, and help the baby to your breast for his or her first feeding. Stage 3  This stage starts immediately after the birth of your baby and ends after you deliver the placenta.  This stage may take anywhere from 5 to 30 minutes.  After your baby has been delivered, you will feel contractions as your body expels the placenta and your uterus contracts to control bleeding. What can I expect after labor and delivery?  After labor is over, you and your baby will be monitored closely until you are ready to go home to ensure that you are both healthy. Your health care team will teach you how to care for yourself and your baby.  You and your baby will stay in the same room (rooming in) during your hospital stay. This will encourage early bonding and successful breastfeeding.  You may continue to receive fluids and medicines through an IV.  Your uterus will be checked and massaged regularly (fundal massage).  You will have some soreness and pain in your abdomen, vagina, and the area of skin between your vaginal opening and your anus (perineum).  If an incision was made near your vagina (episiotomy) or if you had some vaginal tearing during delivery, cold compresses may be placed on your episiotomy or your tear. This helps to reduce pain and swelling.  You may be given a squirt bottle to use instead of wiping when you go to the bathroom. To use the squirt bottle, follow these steps: ? Before you urinate, fill the squirt bottle with warm water. Do not use hot water. ? After you urinate, while you are sitting on the toilet, use the squirt bottle to rinse the area  around your urethra and vaginal opening. This rinses away any urine and blood. ? Fill the squirt bottle with clean water every time you use the bathroom.  It is normal to have vaginal bleeding after delivery. Wear a sanitary pad for vaginal bleeding and discharge. Summary  Vaginal delivery means that you will give birth by  pushing your baby out of your birth canal (vagina).  Your health care provider may monitor your contractions (uterine monitoring) and your baby's heart rate (fetal monitoring).  Your health care provider may perform a physical exam.  Normal labor and delivery is divided into three stages.  After labor is over, you and your baby will be monitored closely until you are ready to go home. This information is not intended to replace advice given to you by your health care provider. Make sure you discuss any questions you have with your health care provider. Document Revised: 04/27/2017 Document Reviewed: 04/27/2017 Elsevier Patient Education  2020 [employer]   

## 2019-09-19 NOTE — Progress Notes (Signed)
    Routine Prenatal Care Visit  Subjective  Chelsea Mcpherson is a 24 y.o. G2P1001 at [redacted]w[redacted]d being seen today for ongoing prenatal care.  She is currently monitored for the following issues for this low-risk pregnancy and has Supervision of other normal pregnancy, antepartum and Ventral hernia without obstruction or gangrene on their problem list.  ----------------------------------------------------------------------------------- Patient reports no complaints.   Contractions: Not present. Vag. Bleeding: None.  Movement: Present. Denies leaking of fluid.  ----------------------------------------------------------------------------------- The following portions of the patient's history were reviewed and updated as appropriate: allergies, current medications, past family history, past medical history, past social history, past surgical history and problem list. Problem list updated.   Objective  Blood pressure 120/72, height 5\' 7"  (1.702 m), weight 205 lb 6.4 oz (93.2 kg), last menstrual period 01/19/2019. Pregravid weight 180 lb (81.6 kg) Total Weight Gain 25 lb 6.4 oz (11.5 kg) Urinalysis:      Fetal Status: Fetal Heart Rate (bpm): 156 Fundal Height: 35 cm Movement: Present     General:  Alert, oriented and cooperative. Patient is in no acute distress.  Skin: Skin is warm and dry. No rash noted.   Cardiovascular: Normal heart rate noted  Respiratory: Normal respiratory effort, no problems with respiration noted  Abdomen: Soft, gravid, appropriate for gestational age. Pain/Pressure: Absent     Pelvic:  Cervical exam deferred        Extremities: Normal range of motion.  Edema: None  Mental Status: Normal mood and affect. Normal behavior. Normal judgment and thought content.     Assessment   24 y.o. G2P1001 at [redacted]w[redacted]d by  10/26/2019, by Last Menstrual Period presenting for routine prenatal visit  Plan   Pregnancy #2 Problems (from 01/19/19 to present)    Problem Noted Resolved    Supervision of other normal pregnancy, antepartum 05/24/2019 by 05/26/2019, CNM No   Overview Addendum 08/21/2019  8:23 AM by 08/23/2019, MD    Clinic [facility] Prenatal Labs  Dating No early u/s Blood type: A/Positive/-- (02/26 1203)   Genetic Screen  declined all Antibody:Negative (02/26 1203)  Anatomic 10-24-1982 complete Rubella: 2.18 (02/26 1203)  Varicella: Equivocal  GTT 28 wk: 74 RPR: Non Reactive (04/26 0947)   Rhogam  not needed HBsAg: Negative (02/26 1203)   Vaccines TDAP:                       Flu Shot: declined HIV: Non Reactive (04/26 0947)   Baby Food Wants to try breastfeeding                               GBS:   Contraception  Pap: uncertain last PAP date PAP done 05/24/19  CBB     CS/VBAC NA   Support Person Partner [facility]          Previous Version      CBC today- patient reports she has been taking oral iron.  Gestational age appropriate obstetric precautions including but not limited to vaginal bleeding, contractions, leaking of fluid and fetal movement were reviewed in detail with the patient.    Return in about 1 week (around 09/26/2019) for ROB in person.  09/28/2019 MD [facility], [facility] 09/19/2019, 8:28 AM

## 2019-09-20 LAB — CBC
Hematocrit: 30.6 % — ABNORMAL LOW (ref 34.0–46.6)
Hemoglobin: 10.5 g/dL — ABNORMAL LOW (ref 11.1–15.9)
MCH: 30.2 pg (ref 26.6–33.0)
MCHC: 34.3 g/dL (ref 31.5–35.7)
MCV: 88 fL (ref 79–97)
Platelets: 235 10*3/uL (ref 150–450)
RBC: 3.48 x10E6/uL — ABNORMAL LOW (ref 3.77–5.28)
RDW: 12.1 % (ref 11.7–15.4)
WBC: 8.1 10*3/uL (ref 3.4–10.8)

## 2019-09-20 LAB — HEPATITIS C ANTIBODY: Hep C Virus Ab: <0.1 s/co ratio (ref 0.0–0.9)

## 2019-10-03 ENCOUNTER — Encounter: Payer: Self-pay | Admitting: Obstetrics and Gynecology

## 2019-10-03 ENCOUNTER — Other Ambulatory Visit (HOSPITAL_COMMUNITY)
Admission: RE | Admit: 2019-10-03 | Discharge: 2019-10-03 | Disposition: A | Discharge status: Home / Self Care | Payer: Medicaid Other | Source: Ambulatory Visit | Attending: Obstetrics and Gynecology | Admitting: Obstetrics and Gynecology

## 2019-10-03 ENCOUNTER — Other Ambulatory Visit: Payer: Self-pay

## 2019-10-03 ENCOUNTER — Ambulatory Visit (INDEPENDENT_AMBULATORY_CARE_PROVIDER_SITE_OTHER): Payer: Medicaid Other | Admitting: Obstetrics and Gynecology

## 2019-10-03 VITALS — BP 120/70 | Ht 67.0 in | Wt 209.4 lb

## 2019-10-03 DIAGNOSIS — Z3483 Encounter for supervision of other normal pregnancy, third trimester: Secondary | ICD-10-CM | POA: Insufficient documentation

## 2019-10-03 DIAGNOSIS — O99013 Anemia complicating pregnancy, third trimester: Secondary | ICD-10-CM

## 2019-10-03 DIAGNOSIS — Z3480 Encounter for supervision of other normal pregnancy, unspecified trimester: Secondary | ICD-10-CM

## 2019-10-03 DIAGNOSIS — Z3A36 36 weeks gestation of pregnancy: Secondary | ICD-10-CM | POA: Diagnosis not present

## 2019-10-03 LAB — POCT URINALYSIS DIPSTICK OB
Glucose, UA: NEGATIVE
POC,PROTEIN,UA: NEGATIVE

## 2019-10-03 NOTE — Patient Instructions (Signed)
Pain Relief During Labor and Delivery Many things can cause pain during labor and delivery, including:  Pressure on bones and ligaments due to the baby moving through the pelvis.  Stretching of tissues due to the baby moving through the birth canal.  Muscle tension due to anxiety or nervousness.  The uterus tightening (contracting) and relaxing to help move the baby. There are many ways to deal with the pain of labor and delivery. They include:  Taking prenatal classes. Taking these classes helps you know what to expect during your baby's birth. What you learn will increase your confidence and decrease your anxiety.  Practicing relaxation techniques or doing relaxing activities, such as: ? Focused breathing. ? Meditation. ? Visualization. ? Aroma therapy. ? Listening to your favorite music. ? Hypnosis.  Taking a warm shower or bath (hydrotherapy). This may: ? Provide comfort and relaxation. ? Lessen your perception of pain. ? Decrease the amount of pain medicine needed. ? Decrease the length of labor.  Getting a massage or counterpressure on your back.  Applying warm packs or ice packs.  Changing positions often, moving around, or using a birthing ball.  Getting: ? Pain medicine through an IV or injection into a muscle. ? Pain medicine inserted into your spinal column. ? Injections of sterile water just under the skin on your lower back (intradermal injections). ? Laughing gas (nitrous oxide). Discuss your pain control options with your health care provider during your prenatal visits. Explore the options offered by your hospital or birth center. What kinds of medicine are available? There are two kinds of medicines that can be used to relieve pain during labor and delivery:  Analgesics. These medicines decrease pain without causing you to lose feeling or the ability to move your muscles.  Anesthetics. These medicines block feeling in the body and can decrease your  ability to move freely. Both of these kinds of medicine can cause minor side effects, such as nausea, trouble concentrating, and sleepiness. They can also decrease the baby's heart rate before birth and affect the baby's breathing rate after birth. For this reason, health care providers are careful about when and how much medicine is given. What are specific medicines and procedures that provide pain relief? Local Anesthetics Local anesthetics are used to numb a small area of the body. They may be used along with another kind of anesthetic or used to numb the nerves of the vagina, cervix, and perineum during the second stage of labor. General Anesthetics General anesthetics cause you to lose consciousness so you do not feel pain. They are usually only used for an emergency cesarean delivery. General anesthetics are given through an IV tube and a mask. Pudendal Block A pudendal block is a form of local anesthetic. It may be used to relieve the pain associated with pushing or stretching of the perineum at the time of delivery or to further numb the perineum. A pudendal block is done by injecting numbing medicine through the vaginal wall into a nerve in the pelvis. Epidural Analgesia Epidural analgesia is given through a flexible IV catheter that is inserted into the lower back. Numbing medicine is delivered continuously to the area near your spinal column nerves (epidural space). After having this type of analgesia, you may be able to move your legs but you most likely will not be able to walk. Depending on the amount of medicine given, you may lose all feeling in the lower half of your body, or you may retain some level   of sensation, including the urge to push. Epidural analgesia can be used to provide pain relief for a vaginal birth. Spinal Block A spinal block is similar to epidural analgesia, but the medicine is injected into the spinal fluid instead of the epidural space. A spinal block is only given  once. It starts to relieve pain quickly, but the pain relief lasts only 1-6 hours. Spinal blocks can be used for cesarean deliveries. Combined Spinal-Epidural (CSE) Block A CSE block combines the effects of a spinal block and epidural analgesia. The spinal block works quickly to block all pain. The epidural analgesia provides continuous pain relief, even after the effects of the spinal block have worn off. This information is not intended to replace advice given to you by your health care provider. Make sure you discuss any questions you have with your health care provider. Document Revised: 03/05/2017 Document Reviewed: 08/14/2015 Elsevier Patient Education  2020 [employer]   Vaginal Delivery  Vaginal delivery means that you give birth by pushing your baby out of your birth canal (vagina). A team of health care providers will help you before, during, and after vaginal delivery. Birth experiences are unique for every woman and every pregnancy, and birth experiences vary depending on where you choose to give birth. What happens when I arrive at the birth center or hospital? Once you are in labor and have been admitted into the hospital or birth center, your health care provider may:  Review your pregnancy history and any concerns that you have.  Insert an IV into one of your veins. This may be used to give you fluids and medicines.  Check your blood pressure, pulse, temperature, and heart rate (vital signs).  Check whether your bag of water (amniotic sac) has broken (ruptured).  Talk with you about your birth plan and discuss pain control options. Monitoring Your health care provider may monitor your contractions (uterine monitoring) and your baby's heart rate (fetal monitoring). You may need to be monitored:  Often, but not continuously (intermittently).  All the time or for long periods at a time (continuously). Continuous monitoring may be needed if: ? You are taking certain  medicines, such as medicine to relieve pain or make your contractions stronger. ? You have pregnancy or labor complications. Monitoring may be done by:  Placing a special stethoscope or a handheld monitoring device on your abdomen to check your baby's heartbeat and to check for contractions.  Placing monitors on your abdomen (external monitors) to record your baby's heartbeat and the frequency and length of contractions.  Placing monitors inside your uterus through your vagina (internal monitors) to record your baby's heartbeat and the frequency, length, and strength of your contractions. Depending on the type of monitor, it may remain in your uterus or on your baby's head until birth.  Telemetry. This is a type of continuous monitoring that can be done with external or internal monitors. Instead of having to stay in bed, you are able to move around during telemetry. Physical exam Your health care provider may perform frequent physical exams. This may include:  Checking how and where your baby is positioned in your uterus.  Checking your cervix to determine: ? Whether it is thinning out (effacing). ? Whether it is opening up (dilating). What happens during labor and delivery?  Normal labor and delivery is divided into the following three stages: Stage 1  This is the longest stage of labor.  This stage can last for hours or days.  Throughout  this stage, you will feel contractions. Contractions generally feel mild, infrequent, and irregular at first. They get stronger, more frequent (about every 2-3 minutes), and more regular as you move through this stage.  This stage ends when your cervix is completely dilated to 4 inches (10 cm) and completely effaced. Stage 2  This stage starts once your cervix is completely effaced and dilated and lasts until the delivery of your baby.  This stage may last from 20 minutes to 2 hours.  This is the stage where you will feel an urge to push your  baby out of your vagina.  You may feel stretching and burning pain, especially when the widest part of your baby's head passes through the vaginal opening (crowning).  Once your baby is delivered, the umbilical cord will be clamped and cut. This usually occurs after waiting a period of 1-2 minutes after delivery.  Your baby will be placed on your bare chest (skin-to-skin contact) in an upright position and covered with a warm blanket. Watch your baby for feeding cues, like rooting or sucking, and help the baby to your breast for his or her first feeding. Stage 3  This stage starts immediately after the birth of your baby and ends after you deliver the placenta.  This stage may take anywhere from 5 to 30 minutes.  After your baby has been delivered, you will feel contractions as your body expels the placenta and your uterus contracts to control bleeding. What can I expect after labor and delivery?  After labor is over, you and your baby will be monitored closely until you are ready to go home to ensure that you are both healthy. Your health care team will teach you how to care for yourself and your baby.  You and your baby will stay in the same room (rooming in) during your hospital stay. This will encourage early bonding and successful breastfeeding.  You may continue to receive fluids and medicines through an IV.  Your uterus will be checked and massaged regularly (fundal massage).  You will have some soreness and pain in your abdomen, vagina, and the area of skin between your vaginal opening and your anus (perineum).  If an incision was made near your vagina (episiotomy) or if you had some vaginal tearing during delivery, cold compresses may be placed on your episiotomy or your tear. This helps to reduce pain and swelling.  You may be given a squirt bottle to use instead of wiping when you go to the bathroom. To use the squirt bottle, follow these steps: ? Before you urinate, fill the  squirt bottle with warm water. Do not use hot water. ? After you urinate, while you are sitting on the toilet, use the squirt bottle to rinse the area around your urethra and vaginal opening. This rinses away any urine and blood. ? Fill the squirt bottle with clean water every time you use the bathroom.  It is normal to have vaginal bleeding after delivery. Wear a sanitary pad for vaginal bleeding and discharge. Summary  Vaginal delivery means that you will give birth by pushing your baby out of your birth canal (vagina).  Your health care provider may monitor your contractions (uterine monitoring) and your baby's heart rate (fetal monitoring).  Your health care provider may perform a physical exam.  Normal labor and delivery is divided into three stages.  After labor is over, you and your baby will be monitored closely until you are ready to go home.  This information is not intended to replace advice given to you by your health care provider. Make sure you discuss any questions you have with your health care provider. Document Revised: 04/27/2017 Document Reviewed: 04/27/2017 Elsevier Patient Education  2020 [employer].   Augmentation of Labor  Augmentation of labor is when steps are taken to stimulate and strengthen contractions of the uterus during labor. This may be done when contractions have slowed down or stopped, delaying progress of labor and delivery of the baby. Before beginning augmentation of labor, your health care provider will evaluate your condition, your baby's condition, the size and position of your baby, and the size of your birth canal. What are some reasons for labor augmentation? Augmentation of labor may be needed when:  You are in labor but your contractions are weak or irregular.  You are in labor but your contractions have stopped. What methods are used for labor augmentation? Labor augmentation may be done by:  Giving medicine that stimulates  contractions (oxytocin). This is given through an IV tube that is inserted into one of your veins.  Breaking the fluid-filled sac that surrounds the fetus (amniotic sac). What are the risks associated with labor augmentation? Some risks of labor augmentation include:  Too much stimulation of the contractions, resulting in continuous, prolonged, or very strong contractions.  Increased risk of infection for you and your baby.  Tearing (rupture) of the uterus.  Breaking off (abruption) of the placenta.  Increased risk of cesarean, forceps, or vacuum delivery.  Excessive bleeding after delivery (postpartum hemorrhage).  Death of the baby (fetal death). What are some reasons for not doing labor augmentation? Augmentation of labor should not be done if:  The baby is too big for the birth canal. This can be confirmed with an ultrasound.  The umbilical cord drops in front of the baby's head or breech part (prolapsed cord).  You have had a cesarean delivery and you had a vertical incision or you do not know what type of incision you had.  You have had surgery on or into your uterus.  You have an active herpes outbreak.  You have cervical cancer.  The placenta blocks the opening of the cervix (placenta previa) or you have other condition that is blocking the cervix or vaginal outlet.  The baby is lying sideways.  Your pelvis is will not permit the passage of the baby.  You are carrying more than two babies. Summary  Augmentation of labor is when steps are taken to stimulate and strengthen contractions of the uterus during labor. This may be done when contractions have slowed down or stopped, delaying progress of labor and delivery of the baby.  Labor augmentation may be done using medicine to stimulate contractions (oxytocin) or by breaking the fluid-filled sac that surrounds the fetus (amniotic sac).  Labor should not be augmented if you have had a cesarean delivery and you had  a vertical incision or you do not know what type of incision you had. This information is not intended to replace advice given to you by your health care provider. Make sure you discuss any questions you have with your health care provider. Document Revised: 01/17/2019 Document Reviewed: 04/27/2016 Elsevier Patient Education  2020 [employer].   Third Trimester of Pregnancy The third trimester is from week 28 through week 40 (months 7 through 9). The third trimester is a time when the unborn baby (fetus) is growing rapidly. At the end of the ninth month, the fetus  is about 20 inches in length and weighs 6-10 pounds. Body changes during your third trimester Your body will continue to go through many changes during pregnancy. The changes vary from woman to woman. During the third trimester:  Your weight will continue to increase. You can expect to gain 25-35 pounds (11-16 kg) by the end of the pregnancy.  You may begin to get stretch marks on your hips, abdomen, and breasts.  You may urinate more often because the fetus is moving lower into your pelvis and pressing on your bladder.  You may develop or continue to have heartburn. This is caused by increased hormones that slow down muscles in the digestive tract.  You may develop or continue to have constipation because increased hormones slow digestion and cause the muscles that push waste through your intestines to relax.  You may develop hemorrhoids. These are swollen veins (varicose veins) in the rectum that can itch or be painful.  You may develop swollen, bulging veins (varicose veins) in your legs.  You may have increased body aches in the pelvis, back, or thighs. This is due to weight gain and increased hormones that are relaxing your joints.  You may have changes in your hair. These can include thickening of your hair, rapid growth, and changes in texture. Some women also have hair loss during or after pregnancy, or hair that feels  dry or thin. Your hair will most likely return to normal after your baby is born.  Your breasts will continue to grow and they will continue to become tender. A yellow fluid (colostrum) may leak from your breasts. This is the first milk you are producing for your baby.  Your belly button may stick out.  You may notice more swelling in your hands, face, or ankles.  You may have increased tingling or numbness in your hands, arms, and legs. The skin on your belly may also feel numb.  You may feel short of breath because of your expanding uterus.  You may have more problems sleeping. This can be caused by the size of your belly, increased need to urinate, and an increase in your body's metabolism.  You may notice the fetus "dropping," or moving lower in your abdomen (lightening).  You may have increased vaginal discharge.  You may notice your joints feel loose and you may have pain around your pelvic bone. What to expect at prenatal visits You will have prenatal exams every 2 weeks until week 36. Then you will have weekly prenatal exams. During a routine prenatal visit:  You will be weighed to make sure you and the baby are growing normally.  Your blood pressure will be taken.  Your abdomen will be measured to track your baby's growth.  The fetal heartbeat will be listened to.  Any test results from the previous visit will be discussed.  You may have a cervical check near your due date to see if your cervix has softened or thinned (effaced).  You will be tested for Group B streptococcus. This happens between 35 and 37 weeks. Your health care provider may ask you:  What your birth plan is.  How you are feeling.  If you are feeling the baby move.  If you have had any abnormal symptoms, such as leaking fluid, bleeding, severe headaches, or abdominal cramping.  If you are using any tobacco products, including cigarettes, chewing tobacco, and electronic cigarettes.  If you have  any questions. Other tests or screenings that may be performed  during your third trimester include:  Blood tests that check for low iron levels (anemia).  Fetal testing to check the health, activity level, and growth of the fetus. Testing is done if you have certain medical conditions or if there are problems during the pregnancy.  Nonstress test (NST). This test checks the health of your baby to make sure there are no signs of problems, such as the baby not getting enough oxygen. During this test, a belt is placed around your belly. The baby is made to move, and its heart rate is monitored during movement. What is false labor? False labor is a condition in which you feel small, irregular tightenings of the muscles in the womb (contractions) that usually go away with rest, changing position, or drinking water. These are called Braxton Hicks contractions. Contractions may last for hours, days, or even weeks before true labor sets in. If contractions come at regular intervals, become more frequent, increase in intensity, or become painful, you should see your health care provider. What are the signs of labor?  Abdominal cramps.  Regular contractions that start at 10 minutes apart and become stronger and more frequent with time.  Contractions that start on the top of the uterus and spread down to the lower abdomen and back.  Increased pelvic pressure and dull back pain.  A watery or bloody mucus discharge that comes from the vagina.  Leaking of amniotic fluid. This is also known as your "water breaking." It could be a slow trickle or a gush. Let your health care provider know if it has a color or strange odor. If you have any of these signs, call your health care provider right away, even if it is before your due date. Follow these instructions at home: Medicines  Follow your health care provider's instructions regarding medicine use. Specific medicines may be either safe or unsafe to take  during pregnancy.  Take a prenatal vitamin that contains at least 600 micrograms (mcg) of folic acid.  If you develop constipation, try taking a stool softener if your health care provider approves. Eating and drinking   Eat a balanced diet that includes fresh fruits and vegetables, whole grains, good sources of protein such as meat, eggs, or tofu, and low-fat dairy. Your health care provider will help you determine the amount of weight gain that is right for you.  Avoid raw meat and uncooked cheese. These carry germs that can cause birth defects in the baby.  If you have low calcium intake from food, talk to your health care provider about whether you should take a daily calcium supplement.  Eat four or five small meals rather than three large meals a day.  Limit foods that are high in fat and processed sugars, such as fried and sweet foods.  To prevent constipation: ? Drink enough fluid to keep your urine clear or pale yellow. ? Eat foods that are high in fiber, such as fresh fruits and vegetables, whole grains, and beans. Activity  Exercise only as directed by your health care provider. Most women can continue their usual exercise routine during pregnancy. Try to exercise for 30 minutes at least 5 days a week. Stop exercising if you experience uterine contractions.  Avoid heavy lifting.  Do not exercise in extreme heat or humidity, or at high altitudes.  Wear low-heel, comfortable shoes.  Practice good posture.  You may continue to have sex unless your health care provider tells you otherwise. Relieving pain and discomfort  Take  frequent breaks and rest with your legs elevated if you have leg cramps or low back pain.  Take warm sitz baths to soothe any pain or discomfort caused by hemorrhoids. Use hemorrhoid cream if your health care provider approves.  Wear a good support bra to prevent discomfort from breast tenderness.  If you develop varicose veins: ? Wear support  pantyhose or compression stockings as told by your healthcare provider. ? Elevate your feet for 15 minutes, 3-4 times a day. Prenatal care  Write down your questions. Take them to your prenatal visits.  Keep all your prenatal visits as told by your health care provider. This is important. Safety  Wear your seat belt at all times when driving.  Make a list of emergency phone numbers, including numbers for family, friends, the hospital, and police and fire departments. General instructions  Avoid cat litter boxes and soil used by cats. These carry germs that can cause birth defects in the baby. If you have a cat, ask someone to clean the litter box for you.  Do not travel far distances unless it is absolutely necessary and only with the approval of your health care provider.  Do not use hot tubs, steam rooms, or saunas.  Do not drink alcohol.  Do not use any products that contain nicotine or tobacco, such as cigarettes and e-cigarettes. If you need help quitting, ask your health care provider.  Do not use any medicinal herbs or unprescribed drugs. These chemicals affect the formation and growth of the baby.  Do not douche or use tampons or scented sanitary pads.  Do not cross your legs for long periods of time.  To prepare for the arrival of your baby: ? Take prenatal classes to understand, practice, and ask questions about labor and delivery. ? Make a trial run to the hospital. ? Visit the hospital and tour the maternity area. ? Arrange for maternity or paternity leave through employers. ? Arrange for family and friends to take care of pets while you are in the hospital. ? Purchase a rear-facing car seat and make sure you know how to install it in your car. ? Pack your hospital bag. ? Prepare the baby's nursery. Make sure to remove all pillows and stuffed animals from the baby's crib to prevent suffocation.  Visit your dentist if you have not gone during your pregnancy. Use a  soft toothbrush to brush your teeth and be gentle when you floss. Contact a health care provider if:  You are unsure if you are in labor or if your water has broken.  You become dizzy.  You have mild pelvic cramps, pelvic pressure, or nagging pain in your abdominal area.  You have lower back pain.  You have persistent nausea, vomiting, or diarrhea.  You have an unusual or bad smelling vaginal discharge.  You have pain when you urinate. Get help right away if:  Your water breaks before 37 weeks.  You have regular contractions less than 5 minutes apart before 37 weeks.  You have a fever.  You are leaking fluid from your vagina.  You have spotting or bleeding from your vagina.  You have severe abdominal pain or cramping.  You have rapid weight loss or weight gain.  You have shortness of breath with chest pain.  You notice sudden or extreme swelling of your face, hands, ankles, feet, or legs.  Your baby makes fewer than 10 movements in 2 hours.  You have severe headaches that do not go  away when you take medicine.  You have vision changes. Summary  The third trimester is from week 28 through week 40, months 7 through 9. The third trimester is a time when the unborn baby (fetus) is growing rapidly.  During the third trimester, your discomfort may increase as you and your baby continue to gain weight. You may have abdominal, leg, and back pain, sleeping problems, and an increased need to urinate.  During the third trimester your breasts will keep growing and they will continue to become tender. A yellow fluid (colostrum) may leak from your breasts. This is the first milk you are producing for your baby.  False labor is a condition in which you feel small, irregular tightenings of the muscles in the womb (contractions) that eventually go away. These are called Braxton Hicks contractions. Contractions may last for hours, days, or even weeks before true labor sets in.  Signs  of labor can include: abdominal cramps; regular contractions that start at 10 minutes apart and become stronger and more frequent with time; watery or bloody mucus discharge that comes from the vagina; increased pelvic pressure and dull back pain; and leaking of amniotic fluid. This information is not intended to replace advice given to you by your health care provider. Make sure you discuss any questions you have with your health care provider. Document Revised: 07/14/2018 Document Reviewed: 04/28/2016 Elsevier Patient Education  2020 [employer]   Iron-Rich Diet  Iron is a mineral that helps your body to produce hemoglobin. Hemoglobin is a protein in red blood cells that carries oxygen to your body's tissues. Eating too little iron may cause you to feel weak and tired, and it can increase your risk of infection. Iron is naturally found in many foods, and many foods have iron added to them (iron-fortified foods). You may need to follow an iron-rich diet if you do not have enough iron in your body due to certain medical conditions. The amount of iron that you need each day depends on your age, your sex, and any medical conditions you have. Follow instructions from your health care provider or a diet and nutrition specialist (dietitian) about how much iron you should eat each day. What are tips for following this plan? Reading food labels  Check food labels to see how many milligrams (mg) of iron are in each serving. Cooking  Cook foods in pots and pans that are made from iron.  Take these steps to make it easier for your body to absorb iron from certain foods: ? Soak beans overnight before cooking. ? Soak whole grains overnight and drain them before using. ? Ferment flours before baking, such as by using yeast in bread dough. Meal planning  When you eat foods that contain iron, you should eat them with foods that are high in vitamin C. These include oranges, peppers, tomatoes, potatoes,  and mango. Vitamin C helps your body to absorb iron. General information  Take iron supplements only as told by your health care provider. An overdose of iron can be life-threatening. If you were prescribed iron supplements, take them with orange juice or a vitamin C supplement.  When you eat iron-fortified foods or take an iron supplement, you should also eat foods that naturally contain iron, such as meat, poultry, and fish. Eating naturally iron-rich foods helps your body to absorb the iron that is added to other foods or contained in a supplement.  Certain foods and drinks prevent your body from absorbing iron properly.  Avoid eating these foods in the same meal as iron-rich foods or with iron supplements. These foods include: ? Coffee, black tea, and red wine. ? Milk, dairy products, and foods that are high in calcium. ? Beans and soybeans. ? Whole grains. What foods should I eat? Fruits Prunes. Raisins. Eat fruits high in vitamin C, such as oranges, grapefruits, and strawberries, alongside iron-rich foods. Vegetables Spinach (cooked). Green peas. Broccoli. Fermented vegetables. Eat vegetables high in vitamin C, such as leafy greens, potatoes, bell peppers, and tomatoes, alongside iron-rich foods. Grains Iron-fortified breakfast cereal. Iron-fortified whole-wheat bread. Enriched rice. Sprouted grains. Meats and other proteins Beef liver. Oysters. Beef. Shrimp. [place]. Chicken. Tuna. Sardines. Chickpeas. Nuts. Tofu. Pumpkin seeds. Beverages Tomato juice. Fresh orange juice. Prune juice. Hibiscus tea. Fortified instant breakfast shakes. Sweets and desserts Blackstrap molasses. Seasonings and condiments Tahini. Fermented soy sauce. Other foods Wheat germ. The items listed above may not be a complete list of recommended foods and beverages. Contact a dietitian for more information. What foods should I avoid? Grains Whole grains. Bran cereal. Bran flour. Oats. Meats and other  proteins Soybeans. Products made from soy protein. Black beans. Lentils. Mung beans. Split peas. Dairy Milk. Cream. Cheese. Yogurt. Cottage cheese. Beverages Coffee. Black tea. Red wine. Sweets and desserts Cocoa. Chocolate. Ice cream. Other foods Basil. Oregano. Large amounts of parsley. The items listed above may not be a complete list of foods and beverages to avoid. Contact a dietitian for more information. Summary  Iron is a mineral that helps your body to produce hemoglobin. Hemoglobin is a protein in red blood cells that carries oxygen to your body's tissues.  Iron is naturally found in many foods, and many foods have iron added to them (iron-fortified foods).  When you eat foods that contain iron, you should eat them with foods that are high in vitamin C. Vitamin C helps your body to absorb iron.  Certain foods and drinks prevent your body from absorbing iron properly, such as whole grains and dairy products. You should avoid eating these foods in the same meal as iron-rich foods or with iron supplements. This information is not intended to replace advice given to you by your health care provider. Make sure you discuss any questions you have with your health care provider. Document Revised: 03/05/2017 Document Reviewed: 02/16/2017 Elsevier Patient Education  2020 [employer].

## 2019-10-03 NOTE — Progress Notes (Signed)
    Routine Prenatal Care Visit  Subjective  Chelsea Mcpherson is a 24 y.o. G2P1001 at [redacted]w[redacted]d being seen today for ongoing prenatal care.  She is currently monitored for the following issues for this low-risk pregnancy and has Supervision of other normal pregnancy, antepartum and Ventral hernia without obstruction or gangrene on their problem list.  ----------------------------------------------------------------------------------- Patient reports no complaints.   Contractions: Not present. Vag. Bleeding: None.  Movement: Present. Denies leaking of fluid.  ----------------------------------------------------------------------------------- The following portions of the patient's history were reviewed and updated as appropriate: allergies, current medications, past family history, past medical history, past social history, past surgical history and problem list. Problem list updated.   Objective  Blood pressure 120/70, height 5\' 7"  (1.702 m), weight 209 lb 6.4 oz (95 kg), last menstrual period 01/19/2019. Pregravid weight 180 lb (81.6 kg) Total Weight Gain 29 lb 6.4 oz (13.3 kg) Urinalysis:      Fetal Status: Fetal Heart Rate (bpm): 150 Fundal Height: 36 cm Movement: Present     General:  Alert, oriented and cooperative. Patient is in no acute distress.  Skin: Skin is warm and dry. No rash noted.   Cardiovascular: Normal heart rate noted  Respiratory: Normal respiratory effort, no problems with respiration noted  Abdomen: Soft, gravid, appropriate for gestational age. Pain/Pressure: Absent     Pelvic:  Cervical exam deferred        Extremities: Normal range of motion.  Edema: None  Mental Status: Normal mood and affect. Normal behavior. Normal judgment and thought content.    Assessment   24 y.o. G2P1001 at [redacted]w[redacted]d by  10/26/2019, by Last Menstrual Period presenting for routine prenatal visit  Plan   Pregnancy #2 Problems (from 01/19/19 to present)    Problem Noted Resolved    Supervision of other normal pregnancy, antepartum 05/24/2019 by 05/26/2019, CNM No   Overview Addendum 08/21/2019  8:23 AM by 08/23/2019, MD    [facility]inic [facility] Prenatal Labs  Dating No early u/s Blood type: A/Positive/-- (02/26 1203)   Genetic Screen  declined all Antibody:Negative (02/26 1203)  Anatomic 10-24-1982 complete Rubella: 2.18 (02/26 1203)  Varicella: Equivocal  GTT 28 wk: 74 RPR: Non Reactive (04/26 0947)   Rhogam  not needed HBsAg: Negative (02/26 1203)   Vaccines TDAP:     09/05/2019                  Flu Shot: declined HIV: Non Reactive (04/26 0947)   Baby Food Wants to try breastfeeding                               GBS:   Contraception  Pap: uncertain last PAP date PAP done 05/24/19  CBB     CS/VBAC NA   Support Person Partner Mozarrod          Previous Version      GBS/ CT/GC today Declines referral to hematology for anemia  Gestational age appropriate obstetric precautions including but not limited to vaginal bleeding, contractions, leaking of fluid and fetal movement were reviewed in detail with the patient.    Return in about 1 week (around 10/10/2019) for ROB in person.  12/11/2019 MD [facility], [facility] 10/03/2019, 10:32 AM

## 2019-10-04 LAB — CERVICOVAGINAL ANCILLARY ONLY
Chlamydia: NEGATIVE
Comment: NEGATIVE
Comment: NORMAL
Neisseria Gonorrhea: NEGATIVE

## 2019-10-07 LAB — CULTURE, BETA STREP (GROUP B ONLY): Strep Gp B Culture: NEGATIVE

## 2019-10-11 ENCOUNTER — Ambulatory Visit (INDEPENDENT_AMBULATORY_CARE_PROVIDER_SITE_OTHER): Payer: Medicaid Other | Admitting: Obstetrics

## 2019-10-11 ENCOUNTER — Other Ambulatory Visit: Payer: Self-pay

## 2019-10-11 VITALS — BP 112/70 | Ht 67.0 in | Wt 209.2 lb

## 2019-10-11 DIAGNOSIS — Z3483 Encounter for supervision of other normal pregnancy, third trimester: Secondary | ICD-10-CM

## 2019-10-11 DIAGNOSIS — Z3480 Encounter for supervision of other normal pregnancy, unspecified trimester: Secondary | ICD-10-CM

## 2019-10-11 DIAGNOSIS — Z3A37 37 weeks gestation of pregnancy: Secondary | ICD-10-CM

## 2019-10-11 LAB — POCT URINALYSIS DIPSTICK OB
Glucose, UA: NEGATIVE
POC,PROTEIN,UA: NEGATIVE

## 2019-10-11 NOTE — Progress Notes (Signed)
  Routine Prenatal Care Visit  Subjective  Chelsea Mcpherson is a 24 y.o. G2P1001 at [redacted]w[redacted]d being seen today for ongoing prenatal care.  She is currently monitored for the following issues for this low-risk pregnancy and has Supervision of other normal pregnancy, antepartum and Ventral hernia without obstruction or gangrene on their problem list.  ----------------------------------------------------------------------------------- Patient reports ample fetal movement. Having some [employer]. Denies any headache, blurred vision, LOF or vaginal bleeding Contractions: Irregular. Vag. Bleeding: None.  Movement: Present. Leaking Fluid denies.  ----------------------------------------------------------------------------------- The following portions of the patient's history were reviewed and updated as appropriate: allergies, current medications, past family history, past medical history, past social history, past surgical history and problem list. Problem list updated.  Objective  Blood pressure 112/70, height 5\' 7"  (1.702 m), weight 209 lb 3.2 oz (94.9 kg), last menstrual period 01/19/2019. Pregravid weight 180 lb (81.6 kg) Total Weight Gain 29 lb 3.2 oz (13.2 kg) Urinalysis: Urine Protein    Urine Glucose    Fetal Status:     Movement: Present     General:  Alert, oriented and cooperative. Patient is in no acute distress.  Skin: Skin is warm and dry. No rash noted.   Cardiovascular: Normal heart rate noted  Respiratory: Normal respiratory effort, no problems with respiration noted  Abdomen: Soft, gravid, appropriate for gestational age. Pain/Pressure: Absent     Pelvic:  Cervical exam performed        Extremities: Normal range of motion.     Mental Status: Normal mood and affect. Normal behavior. Normal judgment and thought content.   Assessment   24 y.o. G2P1001 at [redacted]w[redacted]d by  10/26/2019, by Last Menstrual Period presenting for routine prenatal visit  Plan   Pregnancy #2 Problems  (from 01/19/19 to present)    Problem Noted Resolved   Supervision of other normal pregnancy, antepartum 05/24/2019 by 05/26/2019, CNM No   Overview Addendum 10/03/2019 10:32 AM by 10/05/2019, MD    [facility] Prenatal Labs  Dating No early u/s Blood type: A/Positive/-- (02/26 1203)   Genetic Screen  declined all Antibody:Negative (02/26 1203)  Anatomic 10[birth year]24[birth year]1982 complete Rubella: 2.18 (02/26 1203)  Varicella: Equivocal  GTT 28 wk: 74 RPR: Non Reactive (04/26 0947)   Rhogam  not needed HBsAg: Negative (02/26 1203)   Vaccines TDAP: 09/05/2019                      Flu Shot: declined HIV: Non Reactive (04/26 0947)   Baby Food Wants to try breastfeeding                               GBS:   Contraception  Pap: uncertain last PAP date PAP done 05/24/19  CBB     CS/VBAC NA   Support Person Partner Mozarrod          Previous Version       Term labor symptoms and general obstetric precautions including but not limited to vaginal bleeding, contractions, leaking of fluid and fetal movement were reviewed in detail with the patient. Please refer to After Visit Summary for other counseling recommendations.  Discussed active labor signs and her desire for pain control. She will likely have an epidural, but expressed interest in nitrous if her labor is moving quickly. Return in about 1 week (around 10/18/2019) for return OB.  10/20/2019, CNM  10/11/2019 9:44 AM

## 2019-10-18 ENCOUNTER — Other Ambulatory Visit: Payer: Self-pay

## 2019-10-18 ENCOUNTER — Ambulatory Visit (INDEPENDENT_AMBULATORY_CARE_PROVIDER_SITE_OTHER): Payer: Medicaid Other | Admitting: Obstetrics

## 2019-10-18 VITALS — BP 122/74 | Wt 210.0 lb

## 2019-10-18 DIAGNOSIS — Z3A38 38 weeks gestation of pregnancy: Secondary | ICD-10-CM

## 2019-10-18 DIAGNOSIS — Z3480 Encounter for supervision of other normal pregnancy, unspecified trimester: Secondary | ICD-10-CM

## 2019-10-18 NOTE — Progress Notes (Signed)
No vb. No lof.  

## 2019-10-18 NOTE — Progress Notes (Signed)
°  Routine Prenatal Care Visit  Subjective  Chelsea Mcpherson is a 24 y.o. G2P1001 at [redacted]w[redacted]d being seen today for ongoing prenatal care.  She is currently monitored for the following issues for this high-risk pregnancy and has Supervision of other normal pregnancy, antepartum and Ventral hernia without obstruction or gangrene on their problem list.  ----------------------------------------------------------------------------------- Patient reports no complaints.   Contractions: Regular. Vag. Bleeding: None.  Movement: Present. Leaking Fluid denies. She is eager for labor and requests a cervical "sweep" ----------------------------------------------------------------------------------- The following portions of the patient's history were reviewed and updated as appropriate: allergies, current medications, past family history, past medical history, past social history, past surgical history and problem list. Problem list updated.  Objective  Blood pressure 122/74, weight 210 lb (95.3 kg), last menstrual period 01/19/2019. Pregravid weight 180 lb (81.6 kg) Total Weight Gain 30 lb (13.6 kg) Urinalysis: Urine Protein    Urine Glucose    Fetal Status:     Movement: Present     General:  Alert, oriented and cooperative. Patient is in no acute distress.  Skin: Skin is warm and dry. No rash noted.   Cardiovascular: Normal heart rate noted  Respiratory: Normal respiratory effort, no problems with respiration noted  Abdomen: Soft, gravid, appropriate for gestational age. Pain/Pressure: Present     Pelvic:  Cervical exam performed      3.5 cms/30%, still thick/-3. Cervix is midway and soft  Extremities: Normal range of motion.     Mental Status: Normal mood and affect. Normal behavior. Normal judgment and thought content.   Assessment   24 y.o. G2P1001 at [redacted]w[redacted]d by  10/26/2019, by Last Menstrual Period presenting for routine prenatal visit  Plan   Pregnancy #2 Problems (from 01/19/19 to present)      Problem Noted Resolved   Supervision of other normal pregnancy, antepartum 05/24/2019 by Tresea Mall, CNM No   Overview Addendum 10/18/2019  4:36 PM by Mirna Mires, CNM    [facility]inic [facility] Prenatal Labs  Dating No early u/s Blood type: A/Positive/-- (02/26 1203)   Genetic Screen  declined all Antibody:Negative (02/26 1203)  Anatomic [place] complete Rubella: 2.18 (02/26 1203)  Varicella: Equivocal  GTT 28 wk: 74 RPR: Non Reactive (04/26 0947)   Rhogam  not needed HBsAg: Negative (02/26 1203)   Vaccines TDAP: 09/05/2019                      Flu Shot: declined HIV: Non Reactive (04/26 0947)   Baby Food Wants to try breastfeeding                               GBS: negative  Contraception  Pap: uncertain last PAP date PAP done 05/24/19  CBB     CS/VBAC NA   Support Person Partner Mozarrod          Previous Version       Term labor symptoms and general obstetric precautions including but not limited to vaginal bleeding, contractions, leaking of fluid and fetal movement were reviewed in detail with the patient. Please refer to After Visit Summary for other counseling recommendations.   Return in about 1 week (around 10/25/2019) for return OB.  Discussed the option of IOL at 41 weeks. With her consent, a cervical "sweep" is performed.  Mirna Mires, CNM  10/18/2019 4:44 PM

## 2019-10-23 ENCOUNTER — Encounter: Payer: Self-pay | Admitting: Advanced Practice Midwife

## 2019-10-23 ENCOUNTER — Other Ambulatory Visit: Payer: Self-pay

## 2019-10-23 ENCOUNTER — Inpatient Hospital Stay
Admission: EM | Admit: 2019-10-23 | Discharge: 2019-10-24 | DRG: 807 | Disposition: A | Discharge status: Home / Self Care | Payer: Medicaid Other | Attending: Obstetrics and Gynecology | Admitting: Obstetrics and Gynecology

## 2019-10-23 DIAGNOSIS — Z3480 Encounter for supervision of other normal pregnancy, unspecified trimester: Secondary | ICD-10-CM

## 2019-10-23 DIAGNOSIS — Z3A39 39 weeks gestation of pregnancy: Secondary | ICD-10-CM

## 2019-10-23 DIAGNOSIS — K439 Ventral hernia without obstruction or gangrene: Secondary | ICD-10-CM | POA: Diagnosis present

## 2019-10-23 DIAGNOSIS — O26893 Other specified pregnancy related conditions, third trimester: Secondary | ICD-10-CM | POA: Diagnosis present

## 2019-10-23 DIAGNOSIS — Z20822 Contact with and (suspected) exposure to covid-19: Secondary | ICD-10-CM | POA: Diagnosis present

## 2019-10-23 DIAGNOSIS — O9962 Diseases of the digestive system complicating childbirth: Principal | ICD-10-CM | POA: Diagnosis present

## 2019-10-23 LAB — CBC
HCT: 32.7 % — ABNORMAL LOW (ref 36.0–46.0)
Hemoglobin: 11 g/dL — ABNORMAL LOW (ref 12.0–15.0)
MCH: 28.7 pg (ref 26.0–34.0)
MCHC: 33.6 g/dL (ref 30.0–36.0)
MCV: 85.4 fL (ref 80.0–100.0)
Platelets: 259 10*3/uL (ref 150–400)
RBC: 3.83 MIL/uL — ABNORMAL LOW (ref 3.87–5.11)
RDW: 13 % (ref 11.5–15.5)
WBC: 11.4 10*3/uL — ABNORMAL HIGH (ref 4.0–10.5)
nRBC: 0 % (ref 0.0–0.2)

## 2019-10-23 LAB — TYPE AND SCREEN
ABO/RH(D): A POS
Antibody Screen: NEGATIVE

## 2019-10-23 LAB — ABO/RH: ABO/RH(D): A POS

## 2019-10-23 LAB — SARS CORONAVIRUS 2 BY RT PCR (HOSPITAL ORDER, PERFORMED IN ~~LOC~~ HOSPITAL LAB): SARS Coronavirus 2: NEGATIVE

## 2019-10-23 MED ORDER — AMMONIA AROMATIC IN INHA
RESPIRATORY_TRACT | Status: AC
  Filled 2019-10-23: qty 10

## 2019-10-23 MED ORDER — SENNOSIDES-DOCUSATE SODIUM 8.6-50 MG PO TABS
2.0000 | ORAL_TABLET | ORAL | Status: DC
  Administered 2019-10-24: 2 via ORAL
  Filled 2019-10-23: qty 2

## 2019-10-23 MED ORDER — PRENATAL MULTIVITAMIN CH
1.0000 | ORAL_TABLET | Freq: Every day | ORAL | Status: DC
  Administered 2019-10-24: 1 via ORAL
  Filled 2019-10-23: qty 1

## 2019-10-23 MED ORDER — OXYTOCIN-SODIUM CHLORIDE 30-0.9 UT/500ML-% IV SOLN
INTRAVENOUS | Status: AC
  Filled 2019-10-23: qty 500

## 2019-10-23 MED ORDER — DIPHENHYDRAMINE HCL 25 MG PO CAPS: 25.0000 mg | ORAL_CAPSULE | Freq: Four times a day (QID) | ORAL | Status: DC | PRN

## 2019-10-23 MED ORDER — OXYTOCIN BOLUS FROM INFUSION
333.0000 mL | Freq: Once | INTRAVENOUS | Status: AC
  Administered 2019-10-23: 333 mL via INTRAVENOUS

## 2019-10-23 MED ORDER — LIDOCAINE HCL (PF) 1 % IJ SOLN
INTRAMUSCULAR | Status: AC
  Filled 2019-10-23: qty 30

## 2019-10-23 MED ORDER — OXYTOCIN 10 UNIT/ML IJ SOLN
INTRAMUSCULAR | Status: AC
  Filled 2019-10-23: qty 2

## 2019-10-23 MED ORDER — ACETAMINOPHEN 325 MG PO TABS: 650.0000 mg | ORAL_TABLET | ORAL | Status: DC | PRN

## 2019-10-23 MED ORDER — SIMETHICONE 80 MG PO CHEW: 80.0000 mg | CHEWABLE_TABLET | ORAL | Status: DC | PRN

## 2019-10-23 MED ORDER — ONDANSETRON HCL 4 MG PO TABS: 4.0000 mg | ORAL_TABLET | ORAL | Status: DC | PRN

## 2019-10-23 MED ORDER — WITCH HAZEL-GLYCERIN EX PADS: 1.0000 "application " | MEDICATED_PAD | CUTANEOUS | Status: DC | PRN

## 2019-10-23 MED ORDER — IBUPROFEN 600 MG PO TABS
600.0000 mg | ORAL_TABLET | Freq: Four times a day (QID) | ORAL | Status: DC
  Administered 2019-10-23 – 2019-10-24 (×4): 600 mg via ORAL
  Filled 2019-10-23 (×4): qty 1

## 2019-10-23 MED ORDER — LIDOCAINE HCL (PF) 1 % IJ SOLN: 30.0000 mL | INTRAMUSCULAR | Status: DC | PRN

## 2019-10-23 MED ORDER — COCONUT OIL OIL: 1.0000 "application " | TOPICAL_OIL | Status: DC | PRN

## 2019-10-23 MED ORDER — DIBUCAINE (PERIANAL) 1 % EX OINT: 1.0000 "application " | TOPICAL_OINTMENT | CUTANEOUS | Status: DC | PRN

## 2019-10-23 MED ORDER — LACTATED RINGERS IV SOLN: INTRAVENOUS | Status: DC

## 2019-10-23 MED ORDER — OXYTOCIN-SODIUM CHLORIDE 30-0.9 UT/500ML-% IV SOLN: 2.5000 [IU]/h | INTRAVENOUS | Status: DC

## 2019-10-23 MED ORDER — LACTATED RINGERS IV SOLN: 500.0000 mL | INTRAVENOUS | Status: DC | PRN

## 2019-10-23 MED ORDER — ONDANSETRON HCL 4 MG/2ML IJ SOLN: 4.0000 mg | INTRAMUSCULAR | Status: DC | PRN

## 2019-10-23 MED ORDER — BENZOCAINE-MENTHOL 20-0.5 % EX AERO: 1.0000 "application " | INHALATION_SPRAY | CUTANEOUS | Status: DC | PRN

## 2019-10-23 MED ORDER — MISOPROSTOL 200 MCG PO TABS
ORAL_TABLET | ORAL | Status: AC
  Filled 2019-10-23: qty 4

## 2019-10-23 MED ORDER — ONDANSETRON HCL 4 MG/2ML IJ SOLN: 4.0000 mg | Freq: Four times a day (QID) | INTRAMUSCULAR | Status: DC | PRN

## 2019-10-23 NOTE — Progress Notes (Signed)
Pt presented with complaints of contractions at 1921. Pt was placed in LDR3, covid swabbed, and IV placed. Pt requested Nitrous Oxide to help with pain of her contractions. Pt was 9 cm upon being checked plus 2. Pt delivered her baby boy at 21 with no complications. Pt was up to the rest room and showered before being transferred to mother/baby.

## 2019-10-23 NOTE — Discharge Summary (Signed)
OB Discharge Summary     Patient Name: Chelsea Mcpherson DOB: [birth year] MRN: [id number]  Date of admission: 10/23/2019 Delivering provider: Rod Can, CNM  Date of Delivery: 10/23/2019  Date of discharge: 10/24/2019  Admitting diagnosis: onset of labor Intrauterine pregnancy: [redacted]w[redacted]d    Secondary diagnosis: None     Discharge diagnosis: Term Pregnancy Delivered                                                                                                Post partum procedures:none  Augmentation: N/A  Complications: None  Hospital course:  Onset of Labor With Vaginal Delivery      24y.o. yo [id number] admitted in Active Labor on 10/23/2019. Patient had an uncomplicated labor course as follows:  Membrane Rupture Time/Date: 7:28 PM ,10/23/2019   Delivery Method:Vaginal, Spontaneous  Episiotomy: none Lacerations: none  Patient had an uncomplicated postpartum course.  On PPD #1 she was afebrile, ambulating without assistance, voiding without difficulty, and lochia was appropriate. Patient met goals for discharge on PPD#1 Patient is discharged home in stable condition on 10/24/19.  Newborn Data: Birth date:10/23/2019  Birth time:7:51 PM  Gender:Female Zhymir Living status:Living  Apgars: 9, 9 Weight: 3250 g, 7 pounds 2 ounces  Physical exam  Vitals:   10/24/19 0254 10/24/19 0731 10/24/19 1113 10/24/19 1948  BP: 101/71 110/72 119/78 130/81  Pulse: 64 64 61 61  Resp: 18 18 20 18   Temp: 98.8 F (37.1 C) 98.4 F (36.9 C) 99.1 F (37.3 C) 98.7 F (37.1 C)  TempSrc: Oral Oral Oral Oral  SpO2: 100% 99%  100%  Weight:      Height:       General: alert, cooperative and no distress Lochia: appropriate Uterine Fundus: firm/ U-1/ML/NT Incision: N/A DVT Evaluation: No evidence of DVT seen on physical exam.  Labs: Lab Results  Component Value Date   WBC 14.2 (H) 10/24/2019   HGB 10.2 (L) 10/24/2019   HCT 29.1 (L) 10/24/2019   MCV 84.8 10/24/2019   PLT 234  10/24/2019    Discharge instruction: per After Visit Summary.  Medications:  Allergies as of 10/24/2019   No Known Allergies     Medication List    TAKE these medications   acetaminophen 500 MG tablet Commonly known as: TYLENOL Take 2 tablets (1,000 mg total) by mouth every 6 (six) hours as needed (for pain scale < 4).   ibuprofen 600 MG tablet Commonly known as: ADVIL Take 1 tablet (600 mg total) by mouth every 6 (six) hours. Start taking on: October 25, 2019   multivitamin-prenatal 27-0.8 MG Tabs tablet Take 1 tablet by mouth daily at 12 noon.            Discharge Care Instructions  (From admission, onward)         Start     Ordered   10/24/19 0000  Discharge wound care:       Comments: Perform wound care instructions   10/24/19 2125          Diet: routine diet  Activity: Advance as tolerated. Pelvic  rest for 6 weeks.   Outpatient follow up:  [facility]. Schedule an appointment as soon as possible for a visit in 6 week(s).   Specialty: Obstetrics and Gynecology Why: postpartum follow up visit/Nexplanon insertion Contact information: [address] [place] [address] [phone]                Postpartum contraception: Nexplanon Rhogam Given postpartum: NA Rubella vaccine given postpartum: no Varicella vaccine given postpartum: no TDaP given antepartum or postpartum: given antepartum  Newborn Delivery   Birth date/time: 10/23/2019 19:51:00 Delivery type: Vaginal, Spontaneous       Baby Feeding: Formula  Disposition:home with mother  SIGNED:  Prentice Docker, MD 10/24/2019 9:25 PM

## 2019-10-23 NOTE — H&P (Signed)
OB History & Physical   History of Present Illness:  Chief Complaint: contractions  HPI:  Chelsea Mcpherson is a 24 y.o. G2P1001 female at [redacted]w[redacted]d dated by LMP.  Her pregnancy has been complicated by ventral hernia without obstruction.    She reports contractions since yesterday.   She reports leakage of fluid.   She reports vaginal bleeding.   She reports fetal movement.    Total weight gain for pregnancy: 13.6 kg   Obstetrical Problem List: Pregnancy #2 Problems (from 01/19/19 to present)    Problem Noted Resolved   Supervision of other normal pregnancy, antepartum 05/24/2019 by Tresea Mall, CNM No   Overview Addendum 10/18/2019  4:36 PM by Mirna Mires, CNM    Clinic [facility] Prenatal Labs  Dating No early u/s Blood type: A/Positive/-- (02/26 1203)   Genetic Screen  declined all Antibody:Negative (02/26 1203)  Anatomic [place] complete Rubella: 2.18 (02/26 1203)  Varicella: Equivocal  GTT 28 wk: 74 RPR: Non Reactive (04/26 0947)   Rhogam  not needed HBsAg: Negative (02/26 1203)   Vaccines TDAP: 09/05/2019                      Flu Shot: declined HIV: Non Reactive (04/26 0947)   Baby Food Wants to try breastfeeding                               GBS: negative  Contraception  Pap: uncertain last PAP date PAP done 05/24/19  CBB     CS/VBAC NA   Support Person Partner Mozarrod          Previous Version       Maternal Medical History:  No past medical history on file.  No past surgical history on file.  No Known Allergies  Prior to Admission medications   Medication Sig Start Date End Date Taking? Authorizing Provider  Prenatal Vit-Fe Fumarate-FA (MULTIVITAMIN-PRENATAL) 27-0.8 MG TABS tablet Take 1 tablet by mouth daily at 12 noon.    [provider]    OB History  Gravida Para Term Preterm AB Living  2 1 1     1   SAB TAB Ectopic Multiple Live Births          1    # Outcome Date GA Lbr Len/2nd Weight Sex Delivery Anes PTL Lv  2 Current           1 Term  02/02/13 [redacted]w[redacted]d  2863 g F Vag-Spont  N LIV    Prenatal care site: [facility]  Social History: She  reports that she has never smoked. She has never used smokeless tobacco. She reports previous drug use. Drug: Marijuana. She reports that she does not drink alcohol.  Family History: family history is not on file.    Review of Systems:  Review of Systems  Constitutional: Negative for chills and fever.  HENT: Negative for congestion, ear discharge, ear pain, hearing loss, sinus pain and sore throat.   Eyes: Negative for blurred vision and double vision.  Respiratory: Negative for cough, shortness of breath and wheezing.   Cardiovascular: Negative for chest pain, palpitations and leg swelling.  Gastrointestinal: Positive for abdominal pain. Negative for blood in stool, constipation, diarrhea, heartburn, melena, nausea and vomiting.  Genitourinary: Negative for dysuria, flank pain, frequency, hematuria and urgency.  Musculoskeletal: Negative for back pain, joint pain and myalgias.  Skin: Negative for itching and rash.  Neurological: Negative  for dizziness, tingling, tremors, sensory change, speech change, focal weakness, seizures, loss of consciousness, weakness and headaches.  Endo/Heme/Allergies: Negative for environmental allergies. Does not bruise/bleed easily.  Psychiatric/Behavioral: Negative for depression, hallucinations, memory loss, substance abuse and suicidal ideas. The patient is not nervous/anxious and does not have insomnia.      Physical Exam:  LMP 01/19/2019 (Exact Date)   Vital Signs: LMP 01/19/2019 (Exact Date)  Constitutional: Well nourished, well developed female in no acute distress.  HEENT: normal Skin: Warm and dry.  Cardiovascular: Regular rate and rhythm.   Extremity: no edema  Respiratory: Clear to auscultation bilateral. Normal respiratory effort Abdomen: FHT present Back: no CVAT Neuro: DTRs 2+, Cranial nerves grossly intact Psych: Alert and Oriented  x3. No memory deficits. Normal mood and affect.  MS: normal gait, normal bilateral lower extremity ROM/strength/stability.  Pelvic exam: (female chaperone present) is not limited by body habitus EGBUS: within normal limits Vagina: within normal limits and with normal mucosa  Cervix: 9/100/0   Baseline FHR: 130 beats/min   Variability: moderate   Accelerations: present   Decelerations: absent Contractions: present frequency: every 2-3 minutes Overall assessment: reassuring  Covid swab pending  No results found for: SARSCOV2NAA]  Assessment:  Chelsea Mcpherson is a 24 y.o. G39P1001 female at [redacted]w[redacted]d with active labor.   Plan:  1. Admit to Labor & Delivery  2. CBC, T&S, Clrs, IVF 3. GBS negative.   4. Fetal well-being: Category I 5. Expect vaginal delivery  Tresea Mall, [facility] 10/23/2019 7:33 PM

## 2019-10-24 LAB — CBC
HCT: 29.1 % — ABNORMAL LOW (ref 36.0–46.0)
Hemoglobin: 10.2 g/dL — ABNORMAL LOW (ref 12.0–15.0)
MCH: 29.7 pg (ref 26.0–34.0)
MCHC: 35.1 g/dL (ref 30.0–36.0)
MCV: 84.8 fL (ref 80.0–100.0)
Platelets: 234 10*3/uL (ref 150–400)
RBC: 3.43 MIL/uL — ABNORMAL LOW (ref 3.87–5.11)
RDW: 13.1 % (ref 11.5–15.5)
WBC: 14.2 10*3/uL — ABNORMAL HIGH (ref 4.0–10.5)
nRBC: 0 % (ref 0.0–0.2)

## 2019-10-24 LAB — RPR: RPR Ser Ql: NONREACTIVE

## 2019-10-24 MED ORDER — ACETAMINOPHEN 500 MG PO TABS: 1000.0000 mg | ORAL_TABLET | Freq: Four times a day (QID) | ORAL | Status: DC | PRN

## 2019-10-24 MED ORDER — IBUPROFEN 600 MG PO TABS: 600.0000 mg | ORAL_TABLET | Freq: Four times a day (QID) | ORAL | 0 refills | Status: DC

## 2019-10-24 NOTE — Progress Notes (Signed)
Post Partum Day 1 Subjective: no complaints, up ad lib, voiding and tolerating PO. Bleeding like a menses. Bottle feeding. Would like to be discharged this evening when baby discharged.  Objective: Blood pressure 119/78, pulse 61, temperature 99.1 F (37.3 C), temperature source Oral, resp. rate 20, height 5\' 7"  (1.702 m), weight 95.3 kg, last menstrual period 01/19/2019, SpO2 99 %, unknown if currently breastfeeding.  Physical Exam:  General: alert, cooperative and no distress Lochia: appropriate Uterine Fundus: firm/ U-1/ML/NT  DVT Evaluation: No evidence of DVT seen on physical exam. No significant calf/ankle edema.  Recent Labs    10/23/19 1940 10/24/19 0530  HGB 11.0* 10.2*  HCT 32.7* 29.1*  WBC 11.4* 14.2*  PLT 259 234    Assessment/Plan: PPD #1-stable Discharge later today when baby discharged A POS/ RI/V equivocal-declines Varivax Bottle Nexplanon TDAP UTD   LOS: 1 day   10/26/19 10/24/2019, 11:35 AM

## 2019-10-24 NOTE — Progress Notes (Signed)
Patient discharged home with infant. Discharge instructions and prescriptions given and reviewed with patient. Patient verbalized understanding. Escorted out by staff.  

## 2019-10-25 ENCOUNTER — Encounter: Payer: Medicaid Other | Admitting: Obstetrics

## 2019-10-25 ENCOUNTER — Telehealth: Payer: Self-pay | Admitting: Advanced Practice Midwife

## 2019-10-25 ENCOUNTER — Other Ambulatory Visit: Payer: Self-pay

## 2019-10-25 NOTE — Telephone Encounter (Signed)
nexplanon insert with JEG on 8/31 at 130

## 2019-10-31 NOTE — Telephone Encounter (Signed)
Noted. Will order to arrive by apt date/time. 

## 2019-12-05 ENCOUNTER — Ambulatory Visit: Payer: Self-pay | Admitting: Advanced Practice Midwife

## 2019-12-22 ENCOUNTER — Encounter: Payer: Self-pay | Admitting: Advanced Practice Midwife

## 2019-12-22 ENCOUNTER — Ambulatory Visit (INDEPENDENT_AMBULATORY_CARE_PROVIDER_SITE_OTHER): Payer: Medicaid Other | Admitting: Advanced Practice Midwife

## 2019-12-22 ENCOUNTER — Other Ambulatory Visit: Payer: Self-pay

## 2019-12-22 DIAGNOSIS — Z30017 Encounter for initial prescription of implantable subdermal contraceptive: Secondary | ICD-10-CM

## 2019-12-22 DIAGNOSIS — Z3046 Encounter for surveillance of implantable subdermal contraceptive: Secondary | ICD-10-CM

## 2019-12-22 DIAGNOSIS — K429 Umbilical hernia without obstruction or gangrene: Secondary | ICD-10-CM

## 2019-12-22 NOTE — Progress Notes (Signed)
Postpartum Visit  Chief Complaint:  Chief Complaint  Patient presents with  . Postpartum Care    History of Present Illness: Patient is a 24 y.o. [id number] presents for postpartum visit.  Review the Delivery Report for details.  Date of delivery: 10/23/2019 Type of delivery: Vaginal delivery - Vacuum or forceps assisted  no Episiotomy No.  Laceration: no  Pregnancy or labor problems:  Umbilical hernia Any problems since the delivery:  no  Newborn Details:  SINGLETON :  1. BabyGender female. Birth weight: 7 pounds 2 ounces Maternal Details:  Breast or formula feeding: formula feeding Intercourse: No  Contraception after delivery: Nexplanon today Any bowel or bladder issues: No  Post partum depression/anxiety noted:  no Edinburgh Post-Partum Depression Score: 0 Date of last PAP: 2021  no abnormalities    Review of Systems: Review of Systems  Constitutional: Negative for chills and fever.  HENT: Negative for congestion, ear discharge, ear pain, hearing loss, sinus pain and sore throat.   Eyes: Negative for blurred vision and double vision.  Respiratory: Negative for cough, shortness of breath and wheezing.   Cardiovascular: Negative for chest pain, palpitations and leg swelling.  Gastrointestinal: Negative for abdominal pain, blood in stool, constipation, diarrhea, heartburn, melena, nausea and vomiting.       Positive for occasional pain at area of umbilical hernia  Genitourinary: Negative for dysuria, flank pain, frequency, hematuria and urgency.  Musculoskeletal: Negative for back pain, joint pain and myalgias.  Skin: Negative for itching and rash.  Neurological: Negative for dizziness, tingling, tremors, sensory change, speech change, focal weakness, seizures, loss of consciousness, weakness and headaches.  Endo/Heme/Allergies: Negative for environmental allergies. Does not bruise/bleed easily.  Psychiatric/Behavioral: Negative for depression, hallucinations, memory  loss, substance abuse and suicidal ideas. The patient is not nervous/anxious and does not have insomnia.      Past Medical History:  History reviewed. No pertinent past medical history.  Past Surgical History:  History reviewed. No pertinent surgical history.  Family History:  History reviewed. No pertinent family history.  Social History:  Social History   Socioeconomic History  . Marital status: Single    Spouse name: Not on file  . Number of children: Not on file  . Years of education: Not on file  . Highest education level: Not on file  Occupational History  . Not on file  Tobacco Use  . Smoking status: Never Smoker  . Smokeless tobacco: Never Used  Vaping Use  . Vaping Use: Never used  Substance and Sexual Activity  . Alcohol use: No  . Drug use: Not Currently    Types: Marijuana  . Sexual activity: Not Currently    Birth control/protection: None  Other Topics Concern  . Not on file  Social History Narrative  . Not on file   Social Determinants of Health   Financial Resource Strain:   . Difficulty of Paying Living Expenses: Not on file  Food Insecurity:   . Worried About Programme researcher, broadcasting/film/video in the Last Year: Not on file  . Ran Out of Food in the Last Year: Not on file  Transportation Needs:   . Lack of Transportation (Medical): Not on file  . Lack of Transportation (Non-Medical): Not on file  Physical Activity:   . Days of Exercise per Week: Not on file  . Minutes of Exercise per Session: Not on file  Stress:   . Feeling of Stress : Not on file  Social Connections:   .  Frequency of Communication with Friends and Family: Not on file  . Frequency of Social Gatherings with Friends and Family: Not on file  . Attends Religious Services: Not on file  . Active Member of Clubs or Organizations: Not on file  . Attends Banker Meetings: Not on file  . Marital Status: Not on file  Intimate Partner Violence:   . Fear of Current or Ex-Partner: Not  on file  . Emotionally Abused: Not on file  . Physically Abused: Not on file  . Sexually Abused: Not on file    Allergies:  No Known Allergies  Medications: Prior to Admission medications   Medication Sig Start Date End Date Taking? Authorizing Provider  etonogestrel (NEXPLANON) 68 MG IMPL implant 1 each by Subdermal route once.   Yes [provider]    Physical Exam Blood pressure 110/80, height 5\' 4"  (1.626 m), weight 195 lb (88.5 kg), last menstrual period 12/14/2019    General: NAD HEENT: normocephalic, anicteric Pulmonary: No increased work of breathing Abdomen: NABS, soft, non-tender, non-distended.  Umbilicus without lesions.  No hepatomegaly, splenomegaly or masses palpable. No evidence of hernia. Genitourinary:  External: Normal external female genitalia.  Normal urethral meatus, normal Bartholin's and Skene's glands.    Vagina: Normal vaginal mucosa, no evidence of prolapse.    Cervix: Grossly normal in appearance, no bleeding, no CMT  Uterus: Non-enlarged, mobile, normal contour.    Adnexa: ovaries non-enlarged, no adnexal masses  Rectal: deferred Extremities: no edema, erythema, or tenderness Neurologic: Grossly intact Psychiatric: mood appropriate, affect full   Edinburgh Postnatal Depression Scale - 12/22/19 1100      Edinburgh Postnatal Depression Scale:  In the Past 7 Days   I have been able to laugh and see the funny side of things. 0    I have looked forward with enjoyment to things. 0    I have blamed myself unnecessarily when things went wrong. 0    I have been anxious or worried for no good reason. 0    I have felt scared or panicky for no good reason. 0    Things have been getting on top of me. 0    I have been so unhappy that I have had difficulty sleeping. 0    I have felt sad or miserable. 0    I have been so unhappy that I have been crying. 0    The thought of harming myself has occurred to me. 0    Edinburgh Postnatal Depression Scale  Total 0           Assessment: 24 y.o. 25 presenting for 6 week postpartum visit  Plan: Problem List Items Addressed This Visit    None    Visit Diagnoses    6 weeks postpartum follow-up    -  Primary   Umbilical hernia without obstruction and without gangrene       Relevant Orders   Ambulatory referral to General Surgery   Nexplanon insertion           1) Contraception - Education given regarding options for contraception, as well as compatibility with breast feeding if applicable.  Patient plans on Nexplanon for contraception.  2)  Pap - ASCCP guidelines and rationale discussed.  ASCCP guidelines and rational discussed.  Patient opts for every 3 years screening interval  3) Patient underwent screening for postpartum depression with no signs of depression   4) Ambulatory referral to general surgery to consult regarding umbilical hernia repair  5) Return in about 1 year (around 12/21/2020) for annual established gyn.   Tresea Mall, CNM [facility] 12/22/2019, 11:57 AM      GYNECOLOGY PROCEDURE NOTE  Patient is a 24 y.o. [id number] presenting for Nexplanon insertion as her desired means of contraception.  She provided informed consent, signed copy in the chart, time out was performed. Self reported LMP of Patient's last menstrual period was 12/14/2019.  She understands that Nexplanon is a progesterone only therapy, and that patients often patients have irregular and unpredictable vaginal bleeding or amenorrhea. She understands that other side effects are possible related to systemic progesterone, including but not limited to, headaches, breast tenderness, nausea, and irritability. While effective at preventing pregnancy long acting reversible contraceptives do not prevent transmission of sexually transmitted diseases and use of barrier methods for this purpose was discussed. The placement procedure for Nexplanon was reviewed with the  patient in detail including risks of nerve injury, infection, bleeding and injury to other muscles or tendons. She understands that the Nexplanon implant is good for 3 years and needs to be removed at the end of that time.  She understands that Nexplanon is an extremely effective option for contraception, with failure rate of <1%. This information is reviewed today and all questions were answered. Informed consent was obtained, both verbally and written.   The patient is healthy and has no contraindications to Implanon use. Urine pregnancy test was performed today and was negative.  Procedure Appropriate time out taken.  Patient placed in dorsal supine with left arm above head, elbow flexed at 90 degrees, arm resting on examination table.  Site of previous Nexplanon was identified. The insertion site was prepped with a two betadine swabs and then injected with 2 ml of 1% lidocaine without epinephrine.  Nexplanon removed form sterile blister packaging,  Device confirmed in needle, before inserting full length of needle, tenting up the skin as the needle was advanced.  The drug eluding rod was then deployed by pulling back the slider per the manufactures recommendation.  The implant was palpable by the clinician as well as the patient.  The insertion site covered dressed with a band aid before applying a kerlex bandage pressure dressing..Minimal blood loss was noted during the procedure. The patient tolerated the procedure well.   She was instructed to wear the bandage for 24 hours, call with any signs of infection.  She was given the Nexplanon card and instructed to have the rod removed in 3 years.   Parke Poisson, CNM [facility] 12/22/19, 12:06 PM    Charge [phone] for nexplanon device, CPT [phone] for procedure J2001 for lidocaine administration Modifer 25, plus Modifer 79 is done during a global billing visit

## 2019-12-28 ENCOUNTER — Ambulatory Visit: Payer: Medicaid Other | Admitting: Surgery

## 2020-01-02 ENCOUNTER — Other Ambulatory Visit: Payer: Self-pay

## 2020-01-02 ENCOUNTER — Ambulatory Visit (INDEPENDENT_AMBULATORY_CARE_PROVIDER_SITE_OTHER): Payer: Medicaid Other | Admitting: Surgery

## 2020-01-02 ENCOUNTER — Ambulatory Visit: Payer: Self-pay | Admitting: Surgery

## 2020-01-02 ENCOUNTER — Encounter: Payer: Self-pay | Admitting: Surgery

## 2020-01-02 VITALS — BP 113/78 | HR 92 | Temp 99.7°F | Ht 66.0 in | Wt 191.6 lb

## 2020-01-02 DIAGNOSIS — K439 Ventral hernia without obstruction or gangrene: Secondary | ICD-10-CM

## 2020-01-02 NOTE — Progress Notes (Signed)
Patient ID: Chelsea Mcpherson, female   DOB: 1996/01/26, 24 y.o.   MRN: [id number]  Chief Complaint: Abdominal hernia  History of Present Illness Chelsea Mcpherson is a 24 y.o. female with supraumbilical pain that comes and goes.  She reports it feels like a cramp from her menstruation.  She noted the bulge about 5 months ago during her pregnancy.  She took photos of it.  She denies any history of nausea, vomiting, fevers or chills.  Denies any history of diarrhea or constipation.  Past Medical History History reviewed. No pertinent past medical history.    History reviewed. No pertinent surgical history.  No Known Allergies  Current Outpatient Medications  Medication Sig Dispense Refill  . etonogestrel (NEXPLANON) 68 MG IMPL implant 1 each by Subdermal route once.     No current facility-administered medications for this visit.    Family History History reviewed. No pertinent family history.    Social History Social History   Tobacco Use  . Smoking status: Never Smoker  . Smokeless tobacco: Never Used  Vaping Use  . Vaping Use: Never used  Substance Use Topics  . Alcohol use: No  . Drug use: Not Currently    Types: Marijuana        Review of Systems  Constitutional: Negative.   HENT: Negative.   Eyes: Negative.   Respiratory: Negative.   Cardiovascular: Negative.   Gastrointestinal: Negative.   Genitourinary: Negative.   Skin: Negative.   Neurological: Negative.   Psychiatric/Behavioral: Negative.       Physical Exam Blood pressure 113/78, pulse 92, temperature 99.7 F (37.6 C), temperature source Oral, height 5\' 6"  (1.676 m), weight 191 lb 9.6 oz (86.9 kg), last menstrual period 12/14/2019, SpO2 96 %, unknown if currently breastfeeding. Last Weight  Most recent update: 01/02/2020  3:33 PM   Weight  86.9 kg (191 lb 9.6 oz)            CONSTITUTIONAL: Well developed, and nourished, appropriately responsive and aware without distress.   EYES: Sclera  non-icteric.   EARS, NOSE, MOUTH AND THROAT: Mask worn.   Hearing is intact to voice.  NECK: Trachea is midline, and there is no jugular venous distension.  LYMPH NODES:  Lymph nodes in the neck are not enlarged. RESPIRATORY:  Lungs are clear, and breath sounds are equal bilaterally. Normal respiratory effort without pathologic use of accessory muscles. CARDIOVASCULAR: Heart is regular in rate and rhythm. GI: The abdomen is soft, nontender, and nondistended.  There is an epigastric lipomatous mass that is likely feeling the difficult to appreciate fascial defect.  Despite her lifting her head, its difficult to get a significant amount of tension on the linea alba to reduce/appreciate the size of the fascial defect.  I did not appreciate hepatosplenomegaly. There were normal bowel sounds.  MUSCULOSKELETAL:  Symmetrical muscle tone appreciated in all four extremities.    SKIN: Skin turgor is normal. No pathologic skin lesions appreciated.  NEUROLOGIC:  Motor and sensation appear grossly normal.  Cranial nerves are grossly without defect. PSYCH:  Alert and oriented to person, place and time. Affect is appropriate for situation.  Data Reviewed I have personally reviewed what is currently available of the patient's imaging, recent labs and medical records.   Labs:  CBC Latest Ref Rng & Units 10/24/2019 10/23/2019 09/19/2019  WBC 4.0 - 10.5 K/uL 14.2(H) 11.4(H) 8.1  Hemoglobin 12.0 - 15.0 g/dL 10.2(L) 11.0(L) 10.5(L)  Hematocrit 36 - 46 % 29.1(L) 32.7(L) 30.6(L)  Platelets 150 -  400 K/uL 234 259 235   CMP Latest Ref Rng & Units 03/07/2015  Glucose 65 - 99 mg/dL 108(H)  BUN 6 - 20 mg/dL 12  Creatinine 0.44 - 1.00 mg/dL 0.95  Sodium 135 - 145 mmol/L 135  Potassium 3.5 - 5.1 mmol/L 3.7  Chloride 101 - 111 mmol/L 105  CO2 22 - 32 mmol/L 23  Calcium 8.9 - 10.3 mg/dL 8.8(L)  Total Protein 6.5 - 8.1 g/dL 7.6  Total Bilirubin 0.3 - 1.2 mg/dL 0.2(L)  Alkaline Phos 38 - 126 U/L 66  AST 15 - 41 U/L 22   ALT 14 - 54 U/L 16      Imaging: No recent CT scan examination.  Patient thought she had one last fall. Within last 24 hrs: No results found.  Assessment    Epigastric hernia. Patient Active Problem List   Diagnosis Date Noted  . Postpartum care following vaginal delivery   . Single live birth   . Ventral hernia without obstruction or gangrene 07/31/2019  . Supervision of other normal pregnancy, antepartum 05/24/2019    Plan    Options of surgical repair discussed with the patient.  We concluded to proceed with primary repair of the fascial defect due to her abdominal wall laxity.  The avoidance of mesh.  She accepts the increased risk of recurrence for the sake of diminished postoperative pain. I discussed possibility of incarceration, strangulation, enlargement in size over time, and the risk of emergency surgery in the face of strangulation.  Also discussed the risk of surgery including recurrence which can be up to 30% in the case of complex hernias, use of prosthetic materials (mesh) and the increased risk of infxn, post-op infxn and the possible need for re-operation and removal of mesh if used, possibility of post-op SBO or ileus, and the risks of general anesthetic including MI, CVA, sudden death or even reaction to anesthetic medications. The patient and family understands the risks, any and all questions were answered to the patient's satisfaction.  Face-to-face time spent with the patient and accompanying care providers(if present) was 30 minutes, with more than 50% of the time spent counseling, educating, and coordinating care of the patient.      Laurelle Skiver M.D., FACS 01/02/2020, 4:55 PM     

## 2020-01-02 NOTE — Patient Instructions (Addendum)
Our surgery scheduler Britta Mccreedy will contact you within the next 24 to 48 hours to discuss surgery. During the call, she will discuss the preparation prior to surgery and also discuss the days and times to get scheduled. Please have the BLUE sheet available when she contacts you. If you have any questions regarding the surgery, please do not hesitate to give our office a call.   Umbilical Hernia, Adult  A hernia is a bulge of tissue that pushes through an opening between muscles. An umbilical hernia happens in the abdomen, near the belly button (umbilicus). The hernia may contain tissues from the small intestine, large intestine, or fatty tissue covering the intestines (omentum). Umbilical hernias in adults tend to get worse over time, and they require surgical treatment. There are several types of umbilical hernias. You may have:  A hernia located just above or below the umbilicus (indirect hernia). This is the most common type of umbilical hernia in adults.  A hernia that forms through an opening formed by the umbilicus (direct hernia).  A hernia that comes and goes (reducible hernia). A reducible hernia may be visible only when you strain, lift something heavy, or cough. This type of hernia can be pushed back into the abdomen (reduced).  A hernia that traps abdominal tissue inside the hernia (incarcerated hernia). This type of hernia cannot be reduced.  A hernia that cuts off blood flow to the tissues inside the hernia (strangulated hernia). The tissues can start to die if this happens. This type of hernia requires emergency treatment. What are the causes? An umbilical hernia happens when tissue inside the abdomen presses on a weak area of the abdominal muscles. What increases the risk? You may have a greater risk of this condition if you:  Are obese.  Have had several pregnancies.  Have a buildup of fluid inside your abdomen (ascites).  Have had surgery that weakens the abdominal  muscles. What are the signs or symptoms? The main symptom of this condition is a painless bulge at or near the belly button. A reducible hernia may be visible only when you strain, lift something heavy, or cough. Other symptoms may include:  Dull pain.  A feeling of pressure. Symptoms of a strangulated hernia may include:  Pain that gets increasingly worse.  Nausea and vomiting.  Pain when pressing on the hernia.  Skin over the hernia becoming red or purple.  Constipation.  Blood in the stool. How is this diagnosed? This condition may be diagnosed based on:  A physical exam. You may be asked to cough or strain while standing. These actions increase the pressure inside your abdomen and force the hernia through the opening in your muscles. Your health care provider may try to reduce the hernia by pressing on it.  Your symptoms and medical history. How is this treated? Surgery is the only treatment for an umbilical hernia. Surgery for a strangulated hernia is done as soon as possible. If you have a small hernia that is not incarcerated, you may need to lose weight before having surgery. Follow these instructions at home:  Lose weight, if told by your health care provider.  Do not try to push the hernia back in.  Watch your hernia for any changes in color or size. Tell your health care provider if any changes occur.  You may need to avoid activities that increase pressure on your hernia.  Do not lift anything that is heavier than 10 lb (4.5 kg) until your health care  provider says that this is safe.  Take over-the-counter and prescription medicines only as told by your health care provider.  Keep all follow-up visits as told by your health care provider. This is important. Contact a health care provider if:  Your hernia gets larger.  Your hernia becomes painful. Get help right away if:  You develop sudden, severe pain near the area of your hernia.  You have pain as  well as nausea or vomiting.  You have pain and the skin over your hernia changes color.  You develop a fever. This information is not intended to replace advice given to you by your health care provider. Make sure you discuss any questions you have with your health care provider. Document Revised: 05/05/2017 Document Reviewed: 09/21/2016 Elsevier Patient Education  2020 [employer].

## 2020-01-02 NOTE — H&P (View-Only) (Signed)
Patient ID: Chelsea Mcpherson, female   DOB: 1996/01/26, 24 y.o.   MRN: [id number]  Chief Complaint: Abdominal hernia  History of Present Illness Chelsea Mcpherson is a 24 y.o. female with supraumbilical pain that comes and goes.  She reports it feels like a cramp from her menstruation.  She noted the bulge about 5 months ago during her pregnancy.  She took photos of it.  She denies any history of nausea, vomiting, fevers or chills.  Denies any history of diarrhea or constipation.  Past Medical History History reviewed. No pertinent past medical history.    History reviewed. No pertinent surgical history.  No Known Allergies  Current Outpatient Medications  Medication Sig Dispense Refill  . etonogestrel (NEXPLANON) 68 MG IMPL implant 1 each by Subdermal route once.     No current facility-administered medications for this visit.    Family History History reviewed. No pertinent family history.    Social History Social History   Tobacco Use  . Smoking status: Never Smoker  . Smokeless tobacco: Never Used  Vaping Use  . Vaping Use: Never used  Substance Use Topics  . Alcohol use: No  . Drug use: Not Currently    Types: Marijuana        Review of Systems  Constitutional: Negative.   HENT: Negative.   Eyes: Negative.   Respiratory: Negative.   Cardiovascular: Negative.   Gastrointestinal: Negative.   Genitourinary: Negative.   Skin: Negative.   Neurological: Negative.   Psychiatric/Behavioral: Negative.       Physical Exam Blood pressure 113/78, pulse 92, temperature 99.7 F (37.6 C), temperature source Oral, height 5\' 6"  (1.676 m), weight 191 lb 9.6 oz (86.9 kg), last menstrual period 12/14/2019, SpO2 96 %, unknown if currently breastfeeding. Last Weight  Most recent update: 01/02/2020  3:33 PM   Weight  86.9 kg (191 lb 9.6 oz)            CONSTITUTIONAL: Well developed, and nourished, appropriately responsive and aware without distress.   EYES: Sclera  non-icteric.   EARS, NOSE, MOUTH AND THROAT: Mask worn.   Hearing is intact to voice.  NECK: Trachea is midline, and there is no jugular venous distension.  LYMPH NODES:  Lymph nodes in the neck are not enlarged. RESPIRATORY:  Lungs are clear, and breath sounds are equal bilaterally. Normal respiratory effort without pathologic use of accessory muscles. CARDIOVASCULAR: Heart is regular in rate and rhythm. GI: The abdomen is soft, nontender, and nondistended.  There is an epigastric lipomatous mass that is likely feeling the difficult to appreciate fascial defect.  Despite her lifting her head, its difficult to get a significant amount of tension on the linea alba to reduce/appreciate the size of the fascial defect.  I did not appreciate hepatosplenomegaly. There were normal bowel sounds.  MUSCULOSKELETAL:  Symmetrical muscle tone appreciated in all four extremities.    SKIN: Skin turgor is normal. No pathologic skin lesions appreciated.  NEUROLOGIC:  Motor and sensation appear grossly normal.  Cranial nerves are grossly without defect. PSYCH:  Alert and oriented to person, place and time. Affect is appropriate for situation.  Data Reviewed I have personally reviewed what is currently available of the patient's imaging, recent labs and medical records.   Labs:  CBC Latest Ref Rng & Units 10/24/2019 10/23/2019 09/19/2019  WBC 4.0 - 10.5 K/uL 14.2(H) 11.4(H) 8.1  Hemoglobin 12.0 - 15.0 g/dL 10.2(L) 11.0(L) 10.5(L)  Hematocrit 36 - 46 % 29.1(L) 32.7(L) 30.6(L)  Platelets 150 -  400 K/uL 234 259 235   CMP Latest Ref Rng & Units 03/07/2015  Glucose 65 - 99 mg/dL 450(T)  BUN 6 - 20 mg/dL 12  Creatinine 8.88 - 2.80 mg/dL 0.34  Sodium 917 - 915 mmol/L 135  Potassium 3.5 - 5.1 mmol/L 3.7  Chloride 101 - 111 mmol/L 105  CO2 22 - 32 mmol/L 23  Calcium 8.9 - 10.3 mg/dL 0.5(W)  Total Protein 6.5 - 8.1 g/dL 7.6  Total Bilirubin 0.3 - 1.2 mg/dL 9.7(X)  Alkaline Phos 38 - 126 U/L 66  AST 15 - 41 U/L 22   ALT 14 - 54 U/L 16      Imaging: No recent CT scan examination.  Patient thought she had one last fall. Within last 24 hrs: No results found.  Assessment    Epigastric hernia. Patient Active Problem List   Diagnosis Date Noted  . Postpartum care following vaginal delivery   . Single live birth   . Ventral hernia without obstruction or gangrene 07/31/2019  . Supervision of other normal pregnancy, antepartum 05/24/2019    Plan    Options of surgical repair discussed with the patient.  We concluded to proceed with primary repair of the fascial defect due to her abdominal wall laxity.  The avoidance of mesh.  She accepts the increased risk of recurrence for the sake of diminished postoperative pain. I discussed possibility of incarceration, strangulation, enlargement in size over time, and the risk of emergency surgery in the face of strangulation.  Also discussed the risk of surgery including recurrence which can be up to 30% in the case of complex hernias, use of prosthetic materials (mesh) and the increased risk of infxn, post-op infxn and the possible need for re-operation and removal of mesh if used, possibility of post-op SBO or ileus, and the risks of general anesthetic including MI, CVA, sudden death or even reaction to anesthetic medications. The patient and family understands the risks, any and all questions were answered to the patient's satisfaction.  Face-to-face time spent with the patient and accompanying care providers(if present) was 30 minutes, with more than 50% of the time spent counseling, educating, and coordinating care of the patient.      Campbell Lerner M.D., FACS 01/02/2020, 4:55 PM

## 2020-01-03 ENCOUNTER — Telehealth: Payer: Self-pay | Admitting: Surgery

## 2020-01-03 NOTE — Telephone Encounter (Signed)
Patient has been advised of Pre-Admission date/time, COVID Testing date and Surgery date.  Surgery Date: 01/12/20 Preadmission Testing Date: 01/09/20 (phone 1p-5p) Covid Testing Date: 01/10/20 - patient advised to [facility] ([address] [facility]) between 8a-1p   Patient has been made aware to call [phone], between 1-3:00pm the day before surgery, to find out what time to arrive for surgery.

## 2020-01-09 ENCOUNTER — Other Ambulatory Visit: Payer: Self-pay

## 2020-01-09 ENCOUNTER — Encounter
Admission: RE | Admit: 2020-01-09 | Discharge: 2020-01-09 | Disposition: A | Discharge status: Home / Self Care | Payer: Medicaid Other | Source: Ambulatory Visit | Attending: Surgery | Admitting: Surgery

## 2020-01-09 DIAGNOSIS — Z20822 Contact with and (suspected) exposure to covid-19: Secondary | ICD-10-CM | POA: Diagnosis not present

## 2020-01-09 DIAGNOSIS — Z01812 Encounter for preprocedural laboratory examination: Secondary | ICD-10-CM | POA: Diagnosis present

## 2020-01-09 HISTORY — DX: Anemia, unspecified: D64.9

## 2020-01-09 NOTE — Patient Instructions (Signed)
Your procedure is scheduled on: Friday 10/8 Report to Day Surgery. To find out your arrival time please call [phone] between 1PM - 3PM on  Thurs.10/7  Remember: Instructions that are not followed completely may result in serious medical risk,  up to and including death, or upon the discretion of your surgeon and anesthesiologist your  surgery may need to be rescheduled.     _X__ 1. Do not eat food after midnight the night before your procedure.                 No chewing gum or hard candies. You may drink clear liquids up to 2 hours                 before you are scheduled to arrive for your surgery- DO not drink clear                 liquids within 2 hours of the start of your surgery.                 Clear Liquids include:  water, apple juice without pulp, clear Gatorade, G2 or                  Gatorade Zero (avoid Red/Purple/Blue), Black Coffee or Tea (Do not add                 anything to coffee or tea). _____2.   Complete the "Ensure Clear Pre-surgery Clear Carbohydrate Drink" provided to you, 2 hours before arrival. **If you       are diabetic you will be provided with an alternative drink, Gatorade Zero or G2.  __X__2.  On the morning of surgery brush your teeth with toothpaste and water, you                may rinse your mouth with mouthwash if you wish.  Do not swallow any toothpaste of mouthwash.     ___ 3.  No Alcohol for 24 hours before or after surgery.   ___ 4.  Do Not Smoke or use e-cigarettes For 24 Hours Prior to Your Surgery.                 Do not use any chewable tobacco products for at least 6 hours prior to                 Surgery.  __  5.  Do not use any recreational drugs (marijuana, cocaine, heroin, ecstasy, MDMA or other)                For at least one week prior to your surgery.  Combination of these drugs with anesthesia                May have life threatening results.  ____  6.  Bring all medications with you on the day of  surgery if instructed.   __x__  7.  Notify your doctor if there is any change in your medical condition      (cold, fever, infections).     Do not wear jewelry, make-up, hairpins, clips or nail polish. Do not wear lotions, powders, or perfumes. You may wear deodorant. Do not shave 48 hours prior to surgery. Do not bring valuables to the hospital.    [facility] is not responsible for any belongings or valuables.  Contacts, dentures or bridgework may not be worn into surgery. Leave your suitcase in the car. After surgery  it may be brought to your room. For patients admitted to the hospital, discharge time is determined by your treatment team.   Patients discharged the day of surgery will not be allowed to drive home.   Make arrangements for someone to be with you for the first 24 hours of your Same Day Discharge.    Please read over the following fact sheets that you were given:     ____ Take these medicines the morning of surgery with A SIP OF WATER:    1. none  2.   3.   4.  5.  6.  ____ Fleet Enema (as directed)   __x__ Use CHG Soap (or wipes) as directed  ____ Use Benzoyl Peroxide Gel as instructed  ____ Use inhalers on the day of surgery  ____ Stop metformin 2 days prior to surgery    ____ Take 1/2 of usual insulin dose the night before surgery. No insulin the morning          of surgery.   ____ Stop Coumadin/Plavix/aspirin on   __x__ Stop Anti-inflammatories ibuprofen aleve or aspirin products today.  May take tylenol   ____ Stop supplements until after surgery.    ____ Bring C-Pap to the hospital.    If you have any questions regarding your pre-procedure instructions,  Please call Pre-admit Testing at [phone] [facility]

## 2020-01-10 ENCOUNTER — Other Ambulatory Visit
Admission: RE | Admit: 2020-01-10 | Discharge: 2020-01-10 | Disposition: A | Discharge status: Home / Self Care | Payer: Medicaid Other | Source: Ambulatory Visit | Attending: Surgery | Admitting: Surgery

## 2020-01-10 DIAGNOSIS — Z01812 Encounter for preprocedural laboratory examination: Secondary | ICD-10-CM | POA: Diagnosis not present

## 2020-01-10 LAB — COMPREHENSIVE METABOLIC PANEL
ALT: 17 U/L (ref 0–44)
AST: 17 U/L (ref 15–41)
Albumin: 3.8 g/dL (ref 3.5–5.0)
Alkaline Phosphatase: 39 U/L (ref 38–126)
Anion gap: 7 (ref 5–15)
BUN: 11 mg/dL (ref 6–20)
CO2: 24 mmol/L (ref 22–32)
Calcium: 8.9 mg/dL (ref 8.9–10.3)
Chloride: 109 mmol/L (ref 98–111)
Creatinine, Ser: 0.93 mg/dL (ref 0.44–1.00)
GFR calc non Af Amer: >60 mL/min (ref >60)
Glucose, Bld: 84 mg/dL (ref 70–99)
Potassium: 4.1 mmol/L (ref 3.5–5.1)
Sodium: 140 mmol/L (ref 135–145)
Total Bilirubin: 0.9 mg/dL (ref 0.3–1.2)
Total Protein: 7.1 g/dL (ref 6.5–8.1)

## 2020-01-10 LAB — CBC WITH DIFFERENTIAL/PLATELET
Abs Immature Granulocytes: 0.01 10*3/uL (ref 0.00–0.07)
Basophils Absolute: 0 10*3/uL (ref 0.0–0.1)
Basophils Relative: 1 %
Eosinophils Absolute: 0.2 10*3/uL (ref 0.0–0.5)
Eosinophils Relative: 3 %
HCT: 38.4 % (ref 36.0–46.0)
Hemoglobin: 12.5 g/dL (ref 12.0–15.0)
Immature Granulocytes: 0 %
Lymphocytes Relative: 40 %
Lymphs Abs: 2.6 10*3/uL (ref 0.7–4.0)
MCH: 28.3 pg (ref 26.0–34.0)
MCHC: 32.6 g/dL (ref 30.0–36.0)
MCV: 87.1 fL (ref 80.0–100.0)
Monocytes Absolute: 0.4 10*3/uL (ref 0.1–1.0)
Monocytes Relative: 6 %
Neutro Abs: 3.3 10*3/uL (ref 1.7–7.7)
Neutrophils Relative %: 50 %
Platelets: 259 10*3/uL (ref 150–400)
RBC: 4.41 MIL/uL (ref 3.87–5.11)
RDW: 15.2 % (ref 11.5–15.5)
WBC: 6.5 10*3/uL (ref 4.0–10.5)
nRBC: 0 % (ref 0.0–0.2)

## 2020-01-10 LAB — SARS CORONAVIRUS 2 (TAT 6-24 HRS): SARS Coronavirus 2: NEGATIVE

## 2020-01-12 ENCOUNTER — Encounter
Admission: RE | Disposition: A | Discharge status: Home / Self Care | Payer: Self-pay | Source: Home / Self Care | Attending: Surgery

## 2020-01-12 ENCOUNTER — Other Ambulatory Visit: Payer: Self-pay

## 2020-01-12 ENCOUNTER — Ambulatory Visit: Payer: Medicaid Other | Admitting: Anesthesiology

## 2020-01-12 ENCOUNTER — Ambulatory Visit
Admission: RE | Admit: 2020-01-12 | Discharge: 2020-01-12 | Disposition: A | Discharge status: Home / Self Care | Payer: Medicaid Other | Attending: Surgery | Admitting: Surgery

## 2020-01-12 ENCOUNTER — Encounter: Payer: Self-pay | Admitting: Surgery

## 2020-01-12 DIAGNOSIS — K439 Ventral hernia without obstruction or gangrene: Secondary | ICD-10-CM | POA: Insufficient documentation

## 2020-01-12 HISTORY — PX: EPIGASTRIC HERNIA REPAIR: SHX404

## 2020-01-12 LAB — POCT URINE PREGNANCY: Preg Test, Ur: NEGATIVE

## 2020-01-12 SURGERY — REPAIR, HERNIA, EPIGASTRIC, ADULT
Anesthesia: General

## 2020-01-12 MED ORDER — LACTATED RINGERS IV SOLN: INTRAVENOUS | Status: DC

## 2020-01-12 MED ORDER — BUPIVACAINE-EPINEPHRINE (PF) 0.25% -1:200000 IJ SOLN
INTRAMUSCULAR | Status: AC
  Filled 2020-01-12: qty 30

## 2020-01-12 MED ORDER — BUPIVACAINE LIPOSOME 1.3 % IJ SUSP
INTRAMUSCULAR | Status: AC
  Filled 2020-01-12: qty 20

## 2020-01-12 MED ORDER — CHLORHEXIDINE GLUCONATE CLOTH 2 % EX PADS
6.0000 | MEDICATED_PAD | Freq: Once | CUTANEOUS | Status: AC
  Administered 2020-01-12: 6 via TOPICAL

## 2020-01-12 MED ORDER — GABAPENTIN 300 MG PO CAPS
ORAL_CAPSULE | ORAL | Status: AC
  Administered 2020-01-12: 300 mg via ORAL
  Filled 2020-01-12: qty 1

## 2020-01-12 MED ORDER — CELECOXIB 200 MG PO CAPS: 200.0000 mg | ORAL_CAPSULE | ORAL | Status: AC

## 2020-01-12 MED ORDER — MIDAZOLAM HCL 2 MG/2ML IJ SOLN
INTRAMUSCULAR | Status: DC | PRN
  Administered 2020-01-12: 2 mg via INTRAVENOUS

## 2020-01-12 MED ORDER — GABAPENTIN 300 MG PO CAPS: 300.0000 mg | ORAL_CAPSULE | ORAL | Status: AC

## 2020-01-12 MED ORDER — MEPERIDINE HCL 50 MG/ML IJ SOLN: 6.2500 mg | INTRAMUSCULAR | Status: DC | PRN

## 2020-01-12 MED ORDER — ACETAMINOPHEN 500 MG PO TABS
ORAL_TABLET | ORAL | Status: AC
  Administered 2020-01-12: 1000 mg via ORAL
  Filled 2020-01-12: qty 2

## 2020-01-12 MED ORDER — DEXAMETHASONE SODIUM PHOSPHATE 10 MG/ML IJ SOLN
INTRAMUSCULAR | Status: AC
  Filled 2020-01-12: qty 1

## 2020-01-12 MED ORDER — CHLORHEXIDINE GLUCONATE 0.12 % MT SOLN: 15.0000 mL | Freq: Once | OROMUCOSAL | Status: AC

## 2020-01-12 MED ORDER — PROPOFOL 10 MG/ML IV BOLUS
INTRAVENOUS | Status: AC
  Filled 2020-01-12: qty 20

## 2020-01-12 MED ORDER — FENTANYL CITRATE (PF) 250 MCG/5ML IJ SOLN
INTRAMUSCULAR | Status: AC
  Filled 2020-01-12: qty 5

## 2020-01-12 MED ORDER — CEFAZOLIN SODIUM-DEXTROSE 2-4 GM/100ML-% IV SOLN
2.0000 g | INTRAVENOUS | Status: AC
  Administered 2020-01-12: 2 g via INTRAVENOUS

## 2020-01-12 MED ORDER — FAMOTIDINE 20 MG PO TABS
ORAL_TABLET | ORAL | Status: AC
  Administered 2020-01-12: 20 mg via ORAL
  Filled 2020-01-12: qty 1

## 2020-01-12 MED ORDER — BUPIVACAINE LIPOSOME 1.3 % IJ SUSP: 20.0000 mL | Freq: Once | INTRAMUSCULAR | Status: DC

## 2020-01-12 MED ORDER — CHLORHEXIDINE GLUCONATE CLOTH 2 % EX PADS: 6.0000 | MEDICATED_PAD | Freq: Once | CUTANEOUS | Status: DC

## 2020-01-12 MED ORDER — LIDOCAINE HCL (CARDIAC) PF 100 MG/5ML IV SOSY
PREFILLED_SYRINGE | INTRAVENOUS | Status: DC | PRN
  Administered 2020-01-12: 100 mg via INTRAVENOUS

## 2020-01-12 MED ORDER — DEXAMETHASONE SODIUM PHOSPHATE 10 MG/ML IJ SOLN
INTRAMUSCULAR | Status: DC | PRN
  Administered 2020-01-12: 10 mg via INTRAVENOUS

## 2020-01-12 MED ORDER — CEFAZOLIN SODIUM-DEXTROSE 2-4 GM/100ML-% IV SOLN
INTRAVENOUS | Status: AC
  Filled 2020-01-12: qty 100

## 2020-01-12 MED ORDER — IBUPROFEN 800 MG PO TABS: 800.0000 mg | ORAL_TABLET | Freq: Three times a day (TID) | ORAL | 0 refills | Status: DC | PRN

## 2020-01-12 MED ORDER — PROPOFOL 10 MG/ML IV BOLUS
INTRAVENOUS | Status: DC | PRN
  Administered 2020-01-12: 250 mg via INTRAVENOUS

## 2020-01-12 MED ORDER — MIDAZOLAM HCL 2 MG/2ML IJ SOLN
INTRAMUSCULAR | Status: AC
  Filled 2020-01-12: qty 2

## 2020-01-12 MED ORDER — OXYCODONE HCL 5 MG PO TABS: 5.0000 mg | ORAL_TABLET | Freq: Once | ORAL | Status: DC | PRN

## 2020-01-12 MED ORDER — LIDOCAINE HCL (PF) 2 % IJ SOLN
INTRAMUSCULAR | Status: AC
  Filled 2020-01-12: qty 5

## 2020-01-12 MED ORDER — HYDROCODONE-ACETAMINOPHEN 5-325 MG PO TABS: 1.0000 | ORAL_TABLET | Freq: Four times a day (QID) | ORAL | 0 refills | Status: DC | PRN

## 2020-01-12 MED ORDER — ACETAMINOPHEN 500 MG PO TABS: 1000.0000 mg | ORAL_TABLET | ORAL | Status: AC

## 2020-01-12 MED ORDER — CELECOXIB 200 MG PO CAPS
ORAL_CAPSULE | ORAL | Status: AC
  Administered 2020-01-12: 200 mg via ORAL
  Filled 2020-01-12: qty 1

## 2020-01-12 MED ORDER — ORAL CARE MOUTH RINSE: 15.0000 mL | Freq: Once | OROMUCOSAL | Status: AC

## 2020-01-12 MED ORDER — PROMETHAZINE HCL 25 MG/ML IJ SOLN: 6.2500 mg | INTRAMUSCULAR | Status: DC | PRN

## 2020-01-12 MED ORDER — FENTANYL CITRATE (PF) 100 MCG/2ML IJ SOLN
INTRAMUSCULAR | Status: DC | PRN
  Administered 2020-01-12 (×2): 50 ug via INTRAVENOUS
  Administered 2020-01-12: 100 ug via INTRAVENOUS
  Administered 2020-01-12: 50 ug via INTRAVENOUS

## 2020-01-12 MED ORDER — CHLORHEXIDINE GLUCONATE 0.12 % MT SOLN
OROMUCOSAL | Status: AC
  Administered 2020-01-12: 15 mL via OROMUCOSAL
  Filled 2020-01-12: qty 15

## 2020-01-12 MED ORDER — FENTANYL CITRATE (PF) 100 MCG/2ML IJ SOLN: 25.0000 ug | INTRAMUSCULAR | Status: DC | PRN

## 2020-01-12 MED ORDER — OXYCODONE HCL 5 MG/5ML PO SOLN: 5.0000 mg | Freq: Once | ORAL | Status: DC | PRN

## 2020-01-12 MED ORDER — ONDANSETRON HCL 4 MG/2ML IJ SOLN
INTRAMUSCULAR | Status: AC
  Filled 2020-01-12: qty 2

## 2020-01-12 MED ORDER — FAMOTIDINE 20 MG PO TABS: 20.0000 mg | ORAL_TABLET | Freq: Once | ORAL | Status: AC

## 2020-01-12 SURGICAL SUPPLY — 31 items
ADH SKN CLS APL DERMABOND .7 (GAUZE/BANDAGES/DRESSINGS)
APL PRP STRL LF DISP 70% ISPRP (MISCELLANEOUS) ×1
BLADE CLIPPER SURG (BLADE) ×3 IMPLANT
CANISTER SUCT 1200ML W/VALVE (MISCELLANEOUS) ×3 IMPLANT
CHLORAPREP W/TINT 26 (MISCELLANEOUS) ×3 IMPLANT
COVER WAND RF STERILE (DRAPES) ×3 IMPLANT
DERMABOND ADVANCED (GAUZE/BANDAGES/DRESSINGS)
DERMABOND ADVANCED .7 DNX12 (GAUZE/BANDAGES/DRESSINGS) IMPLANT
DRAPE LAPAROTOMY 100X77 ABD (DRAPES) ×3 IMPLANT
ELECT CAUTERY BLADE 6.4 (BLADE) ×3 IMPLANT
ELECT REM PT RETURN 9FT ADLT (ELECTROSURGICAL) ×3
ELECTRODE REM PT RTRN 9FT ADLT (ELECTROSURGICAL) ×1 IMPLANT
GLOVE ORTHO TXT STRL SZ7.5 (GLOVE) ×3 IMPLANT
GOWN STRL REUS W/ TWL LRG LVL3 (GOWN DISPOSABLE) ×2 IMPLANT
GOWN STRL REUS W/TWL LRG LVL3 (GOWN DISPOSABLE) ×6
KIT TURNOVER KIT A (KITS) ×3 IMPLANT
NEEDLE HYPO 22GX1.5 SAFETY (NEEDLE) ×3 IMPLANT
NS IRRIG 500ML POUR BTL (IV SOLUTION) ×3 IMPLANT
PACK BASIN MINOR (MISCELLANEOUS) ×3 IMPLANT
SPONGE LAP 18X18 RF (DISPOSABLE) ×3 IMPLANT
STAPLER SKIN PROX 35W (STAPLE) IMPLANT
SUT ETHIBOND 0 MO6 C/R (SUTURE) ×3 IMPLANT
SUT MNCRL 4-0 (SUTURE) ×3
SUT MNCRL 4-0 27XMFL (SUTURE) ×1
SUT VIC AB 2-0 SH 27 (SUTURE)
SUT VIC AB 2-0 SH 27XBRD (SUTURE) IMPLANT
SUT VIC AB 3-0 SH 27 (SUTURE) ×3
SUT VIC AB 3-0 SH 27X BRD (SUTURE) ×1 IMPLANT
SUTURE MNCRL 4-0 27XMF (SUTURE) ×1 IMPLANT
SYR 20ML LL LF (SYRINGE) ×3 IMPLANT
SYR BULB IRRIG 60ML STRL (SYRINGE) ×3 IMPLANT

## 2020-01-12 NOTE — Discharge Instructions (Signed)

## 2020-01-12 NOTE — Interval H&P Note (Signed)
History and Physical Interval Note:  01/12/2020 11:21 AM  Chelsea Mcpherson  has presented today for surgery, with the diagnosis of epigastric hernia.  The various methods of treatment have been discussed with the patient and family. After consideration of risks, benefits and other options for treatment, the patient has consented to  Procedure(s): HERNIA REPAIR EPIGASTRIC ADULT (N/A) as a surgical intervention.  The patient's history has been reviewed, patient examined, no change in status, stable for surgery.  I have reviewed the patient's chart and labs.  Questions were answered to the patient's satisfaction.     Campbell Lerner

## 2020-01-12 NOTE — Anesthesia Procedure Notes (Signed)
Procedure Name: LMA Insertion °Performed by: Coleman Kalas R, CRNA °Pre-anesthesia Checklist: Patient identified, Emergency Drugs available, Suction available and Patient being monitored °Patient Re-evaluated:Patient Re-evaluated prior to induction °Oxygen Delivery Method: Circle system utilized °Preoxygenation: Pre-oxygenation with 100% oxygen °Induction Type: IV induction °LMA: LMA inserted °LMA Size: 4.0 °Tube type: Oral °Number of attempts: 1 °Placement Confirmation: positive ETCO2 and breath sounds checked- equal and bilateral °Dental Injury: Teeth and Oropharynx as per pre-operative assessment  ° ° ° ° ° ° °

## 2020-01-12 NOTE — Transfer of Care (Signed)
Immediate Anesthesia Transfer of Care Note  Patient: Chelsea Mcpherson  Procedure(s) Performed: HERNIA REPAIR EPIGASTRIC ADULT (N/A )  Patient Location: PACU  Anesthesia Type:General  Level of Consciousness: sedated  Airway & Oxygen Therapy: Patient Spontanous Breathing and Patient connected to face mask oxygen  Post-op Assessment: Report given to RN and Post -op Vital signs reviewed and stable  Post vital signs: Reviewed and stable  Last Vitals:  Vitals Value Taken Time  BP 102/52 01/12/20 1308  Temp 36.2 C 01/12/20 1308  Pulse 60 01/12/20 1309  Resp 18 01/12/20 1309  SpO2 100 % 01/12/20 1309  Vitals shown include unvalidated device data.  Last Pain:  Vitals:   01/12/20 1015  TempSrc: Temporal  PainSc: 0-No pain         Complications: No complications documented.

## 2020-01-12 NOTE — Anesthesia Preprocedure Evaluation (Signed)
Anesthesia Evaluation  Patient identified by MRN, date of birth, ID band Patient awake    Reviewed: Allergy & Precautions, NPO status , Patient's Chart, lab work & pertinent test results  History of Anesthesia Complications Negative for: history of anesthetic complications  Airway Mallampati: II  TM Distance: >3 FB Neck ROM: Full    Dental no notable dental hx.    Pulmonary neg pulmonary ROS, neg sleep apnea, neg COPD,    breath sounds clear to auscultation- rhonchi (-) wheezing      Cardiovascular Exercise Tolerance: Good (-) hypertension(-) CAD and (-) Past MI  Rhythm:Regular Rate:Normal - Systolic murmurs and - Diastolic murmurs    Neuro/Psych negative neurological ROS  negative psych ROS   GI/Hepatic negative GI ROS, Neg liver ROS,   Endo/Other  negative endocrine ROSneg diabetes  Renal/GU negative Renal ROS     Musculoskeletal negative musculoskeletal ROS (+)   Abdominal (+) - obese,   Peds  Hematology negative hematology ROS (+)   Anesthesia Other Findings   Reproductive/Obstetrics                             Anesthesia Physical  Anesthesia Plan  ASA: I  Anesthesia Plan: General   Post-op Pain Management:    Induction: Intravenous  PONV Risk Score and Plan: 2 and Ondansetron, Dexamethasone and Midazolam  Airway Management Planned: Oral ETT  Additional Equipment:   Intra-op Plan:   Post-operative Plan: Extubation in OR  Informed Consent: I have reviewed the patients History and Physical, chart, labs and discussed the procedure including the risks, benefits and alternatives for the proposed anesthesia with the patient or authorized representative who has indicated his/her understanding and acceptance.     Dental advisory given  Plan Discussed with: CRNA and Anesthesiologist  Anesthesia Plan Comments:         Anesthesia Quick Evaluation  

## 2020-01-12 NOTE — Op Note (Signed)
Epigastric hernia repair  Pre-operative Diagnosis: Epigastric hernia  Post-operative Diagnosis: same.   Surgeon: Campbell Lerner, M.D., FACS  Anesthesia: General LMA  Findings: 2 cm defect in the linea alba, isolated.  Marked abdominal wall laxity.  Estimated Blood Loss: 5 mL         Specimens: None          Complications: none              Procedure Details  The patient was seen again in the Holding Room. The benefits, complications, treatment options, and expected outcomes were discussed with the patient. The risks of bleeding, infection, recurrence of symptoms, failure to resolve symptoms, unanticipated injury, prosthetic placement, prosthetic infection, any of which could require further surgery were reviewed with the patient. The likelihood of improving the patient's symptoms with return to their baseline status is good.  The patient and/or family concurred with the proposed plan, giving informed consent.  The patient was taken to Operating Room, identified and the procedure verified.    Prior to the induction of general anesthesia, antibiotic prophylaxis was administered. VTE prophylaxis was in place.  General LMA was then administered and tolerated well. After the induction, the patient was positioned in the supine position and the abdomen was prepped with Chloraprep and draped in the sterile fashion.  A Time Out was held and the above information confirmed. Local anesthesia of quarter percent Marcaine with epinephrine was utilized to infiltrate the skin and subcutaneous tissues. A transverse incision was made right over the palpable suprapubic lipomatous mass.  Soft tissues were then dissected out identifying the readily mobile fatty preperitoneal mass.  This was then dissected down to the fascia of the linea alba.  It was then reduced without difficulty.  Via the invaginated hernia sac, I palpated the intra-abdominal wall both cephalad and caudad to confirm that there were no  adjacent defects. I then proceeded with a vest overpants repair utilizing interrupted 2-0 Vicryl sutures, there is absolutely no tension on the closure.  Once this was completed, the wound was then closed with a single dermal suture of 3-0 Vicryl, and the skin was closed a running subcuticular 4-0 Monocryl.  Dermabond was applied to seal as a dressing.      Campbell Lerner M.D., [facility] 01/12/2020 12:57 PM

## 2020-01-12 NOTE — Anesthesia Postprocedure Evaluation (Signed)
Anesthesia Post Note  Patient: Chelsea Mcpherson  Procedure(s) Performed: HERNIA REPAIR EPIGASTRIC ADULT (N/A )  Patient location during evaluation: PACU Anesthesia Type: General Level of consciousness: awake and alert and oriented Pain management: pain level controlled Vital Signs Assessment: post-procedure vital signs reviewed and stable Respiratory status: spontaneous breathing, nonlabored ventilation and respiratory function stable Cardiovascular status: blood pressure returned to baseline and stable Postop Assessment: no signs of nausea or vomiting Anesthetic complications: no   No complications documented.   Last Vitals:  Vitals:   01/12/20 1352 01/12/20 1409  BP: (!) 144/75 (!) 156/78  Pulse: (!) 46 (!) 47  Resp: 11 16  Temp: 36.4 C (!) 36.2 C  SpO2: 100% 100%    Last Pain:  Vitals:   01/12/20 1409  TempSrc: Temporal  PainSc: 0-No pain                 Aneesah Hernan

## 2020-01-13 ENCOUNTER — Encounter: Payer: Self-pay | Admitting: Surgery

## 2020-01-25 ENCOUNTER — Other Ambulatory Visit: Payer: Self-pay

## 2020-01-25 ENCOUNTER — Encounter: Payer: Self-pay | Admitting: Surgery

## 2020-01-25 ENCOUNTER — Ambulatory Visit (INDEPENDENT_AMBULATORY_CARE_PROVIDER_SITE_OTHER): Payer: Medicaid Other | Admitting: Surgery

## 2020-01-25 VITALS — BP 126/86 | HR 72 | Temp 98.3°F | Resp 12 | Ht 66.0 in | Wt 189.4 lb

## 2020-01-25 DIAGNOSIS — Z09 Encounter for follow-up examination after completed treatment for conditions other than malignant neoplasm: Secondary | ICD-10-CM

## 2020-01-25 NOTE — Patient Instructions (Signed)
Follow up as needed, call the office if you have any questions or concerns.   GENERAL POST-OPERATIVE PATIENT INSTRUCTIONS   WOUND CARE INSTRUCTIONS:  Keep a dry clean dressing on the wound if there is drainage. The initial bandage may be removed after 24 hours.  Once the wound has quit draining you may leave it open to air.  If clothing rubs against the wound or causes irritation and the wound is not draining you may cover it with a dry dressing during the daytime.  Try to keep the wound dry and avoid ointments on the wound unless directed to do so.  If the wound becomes bright red and painful or starts to drain infected material that is not clear, please contact your physician immediately.  If the wound is mildly pink and has a thick firm ridge underneath it, this is normal, and is referred to as a healing ridge.  This will resolve over the next 4-6 weeks.  BATHING: You may shower if you have been informed of this by your surgeon. However, Please do not submerge in a tub, hot tub, or pool until incisions are completely sealed or have been told by your surgeon that you may do so.  DIET:  You may eat any foods that you can tolerate.  It is a good idea to eat a high fiber diet and take in plenty of fluids to prevent constipation.  If you do become constipated you may want to take a mild laxative or take ducolax tablets on a daily basis until your bowel habits are regular.  Constipation can be very uncomfortable, along with straining, after recent surgery.  ACTIVITY:  You are encouraged to cough and deep breath or use your incentive spirometer if you were given one, every 15-30 minutes when awake.  This will help prevent respiratory complications and low grade fevers post-operatively if you had a general anesthetic.  You may want to hug a pillow when coughing and sneezing to add additional support to the surgical area, if you had abdominal or chest surgery, which will decrease pain during these times.  You  are encouraged to walk and engage in light activity for the next two weeks.  You should not lift more than 10-15 pounds, 4-6 weeks after surgery as it could put you at increased risk for complications.  Twenty pounds is roughly equivalent to a plastic bag of groceries. At that time- Listen to your body when lifting, if you have pain when lifting, stop and then try again in a few days. Soreness after doing exercises or activities of daily living is normal as you get back in to your normal routine.  MEDICATIONS:  Try to take narcotic medications and anti-inflammatory medications, such as tylenol, ibuprofen, naprosyn, etc., with food.  This will minimize stomach upset from the medication.  Should you develop nausea and vomiting from the pain medication, or develop a rash, please discontinue the medication and contact your physician.  You should not drive, make important decisions, or operate machinery when taking narcotic pain medication.  SUNBLOCK Use sun block to incision area over the next year if this area will be exposed to sun. This helps decrease scarring and will allow you avoid a permanent darkened area over your incision.  QUESTIONS:  Please feel free to call our office if you have any questions, and we will be glad to assist you.     

## 2020-01-25 NOTE — Progress Notes (Signed)
[facility] POST-OP OFFICE VISIT  01/25/2020  HPI: Chelsea Mcpherson is a 24 y.o. female 13 days s/p epigastric hernia repair.  She has no complaints of pain today, denies drainage/redness.  Denies swelling.  Reports a good appetite without nausea or vomiting.  Denies fevers and chills.  Reports good bowel movements.  Vital signs: BP 126/86   Pulse 72   Temp 98.3 F (36.8 C)   Resp 12   Ht 5\' 6"  (1.676 m)   Wt 189 lb 6.4 oz (85.9 kg)   LMP 01/09/2020   SpO2 98%   BMI 30.57 kg/m    Physical Exam: Constitutional: Appears well Abdomen: Soft nontender. Skin: Incision clean, dry and intact.  No significant induration or edema.  Assessment/Plan: This is a 24 y.o. female 13 days s/p epigastric hernia repair  Patient Active Problem List   Diagnosis Date Noted  . Postpartum care following vaginal delivery   . Single live birth   . Ventral hernia without obstruction or gangrene 07/31/2019  . Supervision of other normal pregnancy, antepartum 05/24/2019    -We we discussed lifting precautions.  We discussed potential for recurrence.  Advised her to follow-up as needed.   05/26/2019 M.D., FACS 01/25/2020, 9:28 AM

## 2021-01-28 NOTE — Telephone Encounter (Signed)
Nexplanon rcvd/charged 12/22/2019

## 2021-07-19 ENCOUNTER — Emergency Department: Payer: Medicaid Other

## 2021-07-19 ENCOUNTER — Emergency Department
Admission: EM | Admit: 2021-07-19 | Discharge: 2021-07-19 | Disposition: A | Discharge status: Home / Self Care | Payer: Medicaid Other | Attending: Emergency Medicine | Admitting: Emergency Medicine

## 2021-07-19 ENCOUNTER — Encounter: Payer: Self-pay | Admitting: Radiology

## 2021-07-19 DIAGNOSIS — S8012XA Contusion of left lower leg, initial encounter: Secondary | ICD-10-CM | POA: Insufficient documentation

## 2021-07-19 DIAGNOSIS — O0289 Other abnormal products of conception: Secondary | ICD-10-CM | POA: Insufficient documentation

## 2021-07-19 DIAGNOSIS — S0083XA Contusion of other part of head, initial encounter: Secondary | ICD-10-CM | POA: Insufficient documentation

## 2021-07-19 DIAGNOSIS — Y92481 Parking lot as the place of occurrence of the external cause: Secondary | ICD-10-CM | POA: Insufficient documentation

## 2021-07-19 DIAGNOSIS — O0991 Supervision of high risk pregnancy, unspecified, first trimester: Secondary | ICD-10-CM | POA: Diagnosis not present

## 2021-07-19 DIAGNOSIS — Z3491 Encounter for supervision of normal pregnancy, unspecified, first trimester: Secondary | ICD-10-CM

## 2021-07-19 DIAGNOSIS — S59912A Unspecified injury of left forearm, initial encounter: Secondary | ICD-10-CM | POA: Diagnosis not present

## 2021-07-19 DIAGNOSIS — Z23 Encounter for immunization: Secondary | ICD-10-CM | POA: Diagnosis not present

## 2021-07-19 DIAGNOSIS — S299XXA Unspecified injury of thorax, initial encounter: Secondary | ICD-10-CM | POA: Diagnosis not present

## 2021-07-19 DIAGNOSIS — O9A211 Injury, poisoning and certain other consequences of external causes complicating pregnancy, first trimester: Secondary | ICD-10-CM | POA: Insufficient documentation

## 2021-07-19 DIAGNOSIS — Z3A01 Less than 8 weeks gestation of pregnancy: Secondary | ICD-10-CM | POA: Diagnosis not present

## 2021-07-19 LAB — BASIC METABOLIC PANEL
Anion gap: 6 (ref 5–15)
BUN: 10 mg/dL (ref 6–20)
CO2: 25 mmol/L (ref 22–32)
Calcium: 9.3 mg/dL (ref 8.9–10.3)
Chloride: 106 mmol/L (ref 98–111)
Creatinine, Ser: 0.86 mg/dL (ref 0.44–1.00)
GFR, Estimated: >60 mL/min (ref >60)
Glucose, Bld: 90 mg/dL (ref 70–99)
Potassium: 4.1 mmol/L (ref 3.5–5.1)
Sodium: 137 mmol/L (ref 135–145)

## 2021-07-19 LAB — CBC
HCT: 38.2 % (ref 36.0–46.0)
Hemoglobin: 12.7 g/dL (ref 12.0–15.0)
MCH: 29.4 pg (ref 26.0–34.0)
MCHC: 33.2 g/dL (ref 30.0–36.0)
MCV: 88.4 fL (ref 80.0–100.0)
Platelets: 246 10*3/uL (ref 150–400)
RBC: 4.32 MIL/uL (ref 3.87–5.11)
RDW: 13.8 % (ref 11.5–15.5)
WBC: 7 10*3/uL (ref 4.0–10.5)
nRBC: 0 % (ref 0.0–0.2)

## 2021-07-19 LAB — HCG, QUANTITATIVE, PREGNANCY: hCG, Beta Chain, Quant, S: 12299 m[IU]/mL — ABNORMAL HIGH (ref <5)

## 2021-07-19 LAB — POC URINE PREG, ED: Preg Test, Ur: POSITIVE — AB

## 2021-07-19 MED ORDER — IBUPROFEN 800 MG PO TABS
800.0000 mg | ORAL_TABLET | ORAL | Status: AC
  Administered 2021-07-19: 800 mg via ORAL
  Filled 2021-07-19: qty 1

## 2021-07-19 MED ORDER — TETANUS-DIPHTH-ACELL PERTUSSIS 5-2.5-18.5 LF-MCG/0.5 IM SUSY
0.5000 mL | PREFILLED_SYRINGE | Freq: Once | INTRAMUSCULAR | Status: AC
  Administered 2021-07-19: 0.5 mL via INTRAMUSCULAR
  Filled 2021-07-19: qty 0.5

## 2021-07-19 NOTE — ED Notes (Signed)
Pt taken to see son in flex ?

## 2021-07-19 NOTE — Discharge Instructions (Addendum)
Please call our OB/GYN service to set up a follow-up visit within 1 to 2 weeks. ? ?Return to the ER right away if you experience severe pain, difficulty breathing, headache, feel weak, passout, begin having abdominal pain vaginal discharge bleeding or any vaginal fluid leak or other concerns arise. ?

## 2021-07-19 NOTE — ED Notes (Signed)
DC ppw provided at this time. Pt denies questions. Followup reviewed with pt. Pt provides verbal consent for dc ?
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
## 2021-07-19 NOTE — ED Notes (Signed)
Pt resting in bed.No needs at this time.

## 2022-04-22 ENCOUNTER — Ambulatory Visit: Payer: Self-pay | Admitting: Family

## 2022-04-22 ENCOUNTER — Ambulatory Visit: Payer: Medicaid Other | Admitting: Family

## 2023-06-15 ENCOUNTER — Ambulatory Visit (LOCAL_COMMUNITY_HEALTH_CENTER): Payer: Self-pay

## 2023-06-15 DIAGNOSIS — Z111 Encounter for screening for respiratory tuberculosis: Secondary | ICD-10-CM

## 2023-06-18 ENCOUNTER — Ambulatory Visit

## 2023-06-18 DIAGNOSIS — Z111 Encounter for screening for respiratory tuberculosis: Secondary | ICD-10-CM

## 2023-06-18 LAB — TB SKIN TEST
Induration: 0 mm
TB Skin Test: NEGATIVE
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
